# Patient Record
Sex: Male | Born: 1955 | Race: White | Hispanic: No | Marital: Married | Smoking: Never smoker
Health system: Southern US, Community
[De-identification: ages and names within clinical notes are randomized; demographics above are authoritative.]

## PROBLEM LIST (undated history)

## (undated) DIAGNOSIS — E785 Hyperlipidemia, unspecified: Secondary | ICD-10-CM

## (undated) HISTORY — DX: Hyperlipidemia, unspecified: E78.5

---

## 1998-05-20 HISTORY — PX: FISTULOTOMY: SHX6413

## 1999-03-23 ENCOUNTER — Ambulatory Visit (HOSPITAL_BASED_OUTPATIENT_CLINIC_OR_DEPARTMENT_OTHER): Admission: RE | Admit: 1999-03-23 | Discharge: 1999-03-23 | Payer: Self-pay | Admitting: *Deleted

## 1999-05-18 ENCOUNTER — Ambulatory Visit (HOSPITAL_COMMUNITY): Admission: RE | Admit: 1999-05-18 | Discharge: 1999-05-18 | Payer: Self-pay | Admitting: *Deleted

## 1999-08-03 ENCOUNTER — Encounter: Admission: RE | Admit: 1999-08-03 | Discharge: 1999-08-03 | Payer: Self-pay | Admitting: Urology

## 1999-08-03 ENCOUNTER — Encounter: Payer: Self-pay | Admitting: Urology

## 2000-05-19 ENCOUNTER — Ambulatory Visit (HOSPITAL_COMMUNITY): Admission: RE | Admit: 2000-05-19 | Discharge: 2000-05-19 | Payer: Self-pay | Admitting: Specialist

## 2000-05-19 ENCOUNTER — Encounter: Payer: Self-pay | Admitting: Specialist

## 2004-11-13 ENCOUNTER — Ambulatory Visit: Payer: Self-pay | Admitting: Family Medicine

## 2016-03-15 DIAGNOSIS — Z0000 Encounter for general adult medical examination without abnormal findings: Secondary | ICD-10-CM | POA: Diagnosis not present

## 2016-03-15 DIAGNOSIS — Z23 Encounter for immunization: Secondary | ICD-10-CM | POA: Diagnosis not present

## 2017-03-21 DIAGNOSIS — E559 Vitamin D deficiency, unspecified: Secondary | ICD-10-CM | POA: Diagnosis not present

## 2017-03-21 DIAGNOSIS — E7800 Pure hypercholesterolemia, unspecified: Secondary | ICD-10-CM | POA: Diagnosis not present

## 2017-03-21 DIAGNOSIS — Z0000 Encounter for general adult medical examination without abnormal findings: Secondary | ICD-10-CM | POA: Diagnosis not present

## 2017-03-21 DIAGNOSIS — Z125 Encounter for screening for malignant neoplasm of prostate: Secondary | ICD-10-CM | POA: Diagnosis not present

## 2017-07-08 DIAGNOSIS — J019 Acute sinusitis, unspecified: Secondary | ICD-10-CM | POA: Diagnosis not present

## 2017-09-12 DIAGNOSIS — M549 Dorsalgia, unspecified: Secondary | ICD-10-CM | POA: Diagnosis not present

## 2017-09-25 DIAGNOSIS — M5126 Other intervertebral disc displacement, lumbar region: Secondary | ICD-10-CM | POA: Diagnosis not present

## 2017-09-25 DIAGNOSIS — M5117 Intervertebral disc disorders with radiculopathy, lumbosacral region: Secondary | ICD-10-CM | POA: Diagnosis not present

## 2017-09-25 DIAGNOSIS — M47816 Spondylosis without myelopathy or radiculopathy, lumbar region: Secondary | ICD-10-CM | POA: Diagnosis not present

## 2017-10-31 DIAGNOSIS — R030 Elevated blood-pressure reading, without diagnosis of hypertension: Secondary | ICD-10-CM | POA: Diagnosis not present

## 2017-10-31 DIAGNOSIS — M48062 Spinal stenosis, lumbar region with neurogenic claudication: Secondary | ICD-10-CM | POA: Diagnosis not present

## 2017-10-31 DIAGNOSIS — M5126 Other intervertebral disc displacement, lumbar region: Secondary | ICD-10-CM | POA: Diagnosis not present

## 2017-10-31 DIAGNOSIS — Z6827 Body mass index (BMI) 27.0-27.9, adult: Secondary | ICD-10-CM | POA: Diagnosis not present

## 2017-11-16 DIAGNOSIS — B029 Zoster without complications: Secondary | ICD-10-CM | POA: Diagnosis not present

## 2017-12-03 DIAGNOSIS — R030 Elevated blood-pressure reading, without diagnosis of hypertension: Secondary | ICD-10-CM | POA: Diagnosis not present

## 2017-12-03 DIAGNOSIS — M5126 Other intervertebral disc displacement, lumbar region: Secondary | ICD-10-CM | POA: Diagnosis not present

## 2017-12-03 DIAGNOSIS — M48062 Spinal stenosis, lumbar region with neurogenic claudication: Secondary | ICD-10-CM | POA: Diagnosis not present

## 2017-12-03 DIAGNOSIS — Z6827 Body mass index (BMI) 27.0-27.9, adult: Secondary | ICD-10-CM | POA: Diagnosis not present

## 2017-12-26 DIAGNOSIS — Z23 Encounter for immunization: Secondary | ICD-10-CM | POA: Diagnosis not present

## 2018-01-05 DIAGNOSIS — H6501 Acute serous otitis media, right ear: Secondary | ICD-10-CM | POA: Diagnosis not present

## 2018-01-05 DIAGNOSIS — H9313 Tinnitus, bilateral: Secondary | ICD-10-CM | POA: Diagnosis not present

## 2018-01-28 DIAGNOSIS — H903 Sensorineural hearing loss, bilateral: Secondary | ICD-10-CM | POA: Diagnosis not present

## 2018-01-28 DIAGNOSIS — H9313 Tinnitus, bilateral: Secondary | ICD-10-CM | POA: Diagnosis not present

## 2018-03-06 DIAGNOSIS — Z23 Encounter for immunization: Secondary | ICD-10-CM | POA: Diagnosis not present

## 2018-04-02 DIAGNOSIS — Z0000 Encounter for general adult medical examination without abnormal findings: Secondary | ICD-10-CM | POA: Diagnosis not present

## 2018-04-02 DIAGNOSIS — Z125 Encounter for screening for malignant neoplasm of prostate: Secondary | ICD-10-CM | POA: Diagnosis not present

## 2018-04-02 DIAGNOSIS — E7800 Pure hypercholesterolemia, unspecified: Secondary | ICD-10-CM | POA: Diagnosis not present

## 2018-04-08 DIAGNOSIS — Z23 Encounter for immunization: Secondary | ICD-10-CM | POA: Diagnosis not present

## 2018-04-08 DIAGNOSIS — Z0000 Encounter for general adult medical examination without abnormal findings: Secondary | ICD-10-CM | POA: Diagnosis not present

## 2018-06-26 DIAGNOSIS — R9431 Abnormal electrocardiogram [ECG] [EKG]: Secondary | ICD-10-CM | POA: Diagnosis not present

## 2018-06-26 DIAGNOSIS — R002 Palpitations: Secondary | ICD-10-CM | POA: Diagnosis not present

## 2018-06-28 ENCOUNTER — Other Ambulatory Visit: Payer: Self-pay

## 2018-06-28 ENCOUNTER — Emergency Department (HOSPITAL_COMMUNITY): Payer: BLUE CROSS/BLUE SHIELD

## 2018-06-28 ENCOUNTER — Emergency Department (HOSPITAL_COMMUNITY)
Admission: EM | Admit: 2018-06-28 | Discharge: 2018-06-28 | Disposition: A | Discharge status: Home / Self Care | Payer: BLUE CROSS/BLUE SHIELD | Attending: Emergency Medicine | Admitting: Emergency Medicine

## 2018-06-28 ENCOUNTER — Encounter (HOSPITAL_COMMUNITY): Payer: Self-pay

## 2018-06-28 DIAGNOSIS — Z87891 Personal history of nicotine dependence: Secondary | ICD-10-CM | POA: Insufficient documentation

## 2018-06-28 DIAGNOSIS — Z79899 Other long term (current) drug therapy: Secondary | ICD-10-CM | POA: Insufficient documentation

## 2018-06-28 DIAGNOSIS — R072 Precordial pain: Secondary | ICD-10-CM | POA: Diagnosis not present

## 2018-06-28 DIAGNOSIS — R002 Palpitations: Secondary | ICD-10-CM | POA: Insufficient documentation

## 2018-06-28 DIAGNOSIS — R0789 Other chest pain: Secondary | ICD-10-CM | POA: Diagnosis not present

## 2018-06-28 LAB — CBC
HCT: 48.6 % (ref 39.0–52.0)
Hemoglobin: 16 g/dL (ref 13.0–17.0)
MCH: 30.1 pg (ref 26.0–34.0)
MCHC: 32.9 g/dL (ref 30.0–36.0)
MCV: 91.4 fL (ref 80.0–100.0)
PLATELETS: 218 10*3/uL (ref 150–400)
RBC: 5.32 MIL/uL (ref 4.22–5.81)
RDW: 13.3 % (ref 11.5–15.5)
WBC: 7.1 10*3/uL (ref 4.0–10.5)
nRBC: 0 % (ref 0.0–0.2)

## 2018-06-28 LAB — PHOSPHORUS: Phosphorus: 2.6 mg/dL (ref 2.5–4.6)

## 2018-06-28 LAB — POCT I-STAT TROPONIN I
Troponin i, poc: 0 ng/mL (ref 0.00–0.08)
Troponin i, poc: 0 ng/mL (ref 0.00–0.08)

## 2018-06-28 LAB — HEPATIC FUNCTION PANEL
ALT: 23 U/L (ref 0–44)
AST: 21 U/L (ref 15–41)
Albumin: 4.7 g/dL (ref 3.5–5.0)
Alkaline Phosphatase: 54 U/L (ref 38–126)
Bilirubin, Direct: 0.2 mg/dL (ref 0.0–0.2)
Indirect Bilirubin: 0.4 mg/dL (ref 0.3–0.9)
Total Bilirubin: 0.6 mg/dL (ref 0.3–1.2)
Total Protein: 7.8 g/dL (ref 6.5–8.1)

## 2018-06-28 LAB — BASIC METABOLIC PANEL
Anion gap: 9 (ref 5–15)
BUN: 20 mg/dL (ref 6–20)
CO2: 23 mmol/L (ref 22–32)
Calcium: 9.3 mg/dL (ref 8.9–10.3)
Chloride: 106 mmol/L (ref 98–111)
Creatinine, Ser: 1.02 mg/dL (ref 0.61–1.24)
GFR calc Af Amer: >60 mL/min (ref >60)
GFR calc non Af Amer: >60 mL/min (ref >60)
Glucose, Bld: 105 mg/dL — ABNORMAL HIGH (ref 70–99)
Potassium: 3.7 mmol/L (ref 3.5–5.1)
SODIUM: 138 mmol/L (ref 135–145)

## 2018-06-28 LAB — MAGNESIUM: MAGNESIUM: 2.5 mg/dL — AB (ref 1.7–2.4)

## 2018-06-28 LAB — TSH: TSH: 2.985 u[IU]/mL (ref 0.350–4.500)

## 2018-06-28 MED ORDER — FAMOTIDINE 20 MG PO TABS: 20.0000 mg | ORAL_TABLET | Freq: Two times a day (BID) | ORAL | 0 refills | Status: DC

## 2018-06-28 MED ORDER — SODIUM CHLORIDE 0.9% FLUSH: 3.0000 mL | Freq: Once | INTRAVENOUS | Status: DC

## 2018-06-28 MED ORDER — ASPIRIN 81 MG PO CHEW
324.0000 mg | CHEWABLE_TABLET | Freq: Once | ORAL | Status: AC
  Administered 2018-06-28: 324 mg via ORAL
  Filled 2018-06-28: qty 4

## 2018-06-28 MED ORDER — ASPIRIN 81 MG PO CHEW: 81.0000 mg | CHEWABLE_TABLET | Freq: Every day | ORAL | 0 refills | Status: AC

## 2018-06-28 NOTE — ED Triage Notes (Signed)
Pt states chest discomfort x 1 week. Pt states that it felt like fluttering Monday and Tuesday. Pt states he went to [facility], and had an EKG done. Pt states that it was abnormal, but that it was typical for him, as he had an event 9 years ago. Pt states he was told to come to the ED if the pain increased. The pain this morning had him wake up covered in sweat, and some dizziness as well. Pt states pain is on left chest. Pt states he has cardiology appt scheduled for 07/07/18.

## 2018-06-28 NOTE — Discharge Instructions (Signed)
1.  Call your cardiologist office and let them know you are seen in the emergency department.  See if you can be seen this week for recheck. 2.  Have a recheck with your family doctor this week if you cannot be seen by cardiology. 3.  Take a daily aspirin.  Take Pepcid twice a day in the morning and evening.  Take before your aspirin and with some food. 4.  Return to the emergency department if you have recurrence worsening new or concerning symptoms.

## 2018-06-28 NOTE — ED Notes (Signed)
ED Provider at bedside. 

## 2018-06-28 NOTE — ED Provider Notes (Signed)
[facility]-EMERGENCY DEPT Provider Note   CSN: [id number] Arrival date & time: 06/28/18  4503     History   Chief Complaint Chief Complaint  Patient presents with  . Chest Pain    HPI Raymond Wilson is a 63 y.o. male.  HPI Over the past week the patient has had several episodes of palpitations.  He first noted this on Monday.  There was a feeling of fluttering or racing in his left upper chest.  He did not have associated symptoms at that time.  It did resolve.  The following day he went on to workout at the gym and had no issues with chest pain or shortness of breath.  The subsequent day again he noted some fluttering in his chest.  Again it resolved and he did not note other symptoms.  Patient was having a few sporadic episodes and followed up with his PCP on Friday.  They did an EKG and did not find that there was anything concerning although they made note of a discrepancy between a much more distant EKG and the present 1.  Follow-up with cardiology was arranged for the 18th of this month.  Patient reports this morning however he awakened with a fullness and tightness sensation in his left chest.  Rather than abating it seemed to escalate.  Patient also noted that he felt flushed and a little sweaty.  In addition he felt lightheaded.  No syncopal episode.  Patient denies he has been sick in any way recently been active, going to the gym with negative review of systems.  Patient however does note that he mistakenly has been taking extra high doses of vitamin D supplement.  He was supposed to take 3000 IU and did not recognize that the bottle he was taking was 10,000 IU which she has been taking daily.  He reports he is been doing that for 3 to 4 weeks.  He however has negative review of systems for generalized symptoms of any sort. History reviewed. No pertinent past medical history.  There are no active problems to display for this patient.   Past Surgical History:    Procedure Laterality Date  . FISTULOTOMY  2000        Home Medications    Prior to Admission medications   Medication Sig Start Date End Date Taking? Authorizing Provider  Ascorbic Acid (VITAMIN C) 1000 MG tablet Take 1,000 mg by mouth daily.   Yes [provider]  Cholecalciferol (VITAMIN D3) 50 MCG (2000 UT) TABS Take 2,000 Units by mouth daily.   Yes [provider]  Multiple Vitamin (MULTIVITAMIN WITH MINERALS) TABS tablet Take 1 tablet by mouth daily.   Yes [provider]  naproxen sodium (ALEVE) 220 MG tablet Take 440 mg by mouth daily.   Yes [provider]  Omega-3 Fatty Acids (EQL OMEGA 3 FISH OIL) 1400 MG CAPS Take 1,400 mg by mouth daily.   Yes [provider]  pravastatin (PRAVACHOL) 10 MG tablet Take 10 mg by mouth daily. 06/21/18  Yes [provider]  aspirin 81 MG chewable tablet Chew 1 tablet (81 mg total) by mouth daily. 06/28/18   Arby Barrette, MD  famotidine (PEPCID) 20 MG tablet Take 1 tablet (20 mg total) by mouth 2 (two) times daily. 06/28/18   Arby Barrette, MD    Family History No family history on file.  Social History Social History   Tobacco Use  . Smoking status: Never Smoker  .  Smokeless tobacco: Never Used  Substance Use Topics  . Alcohol use: Yes    Comment: socially  . Drug use: Never     Allergies   Patient has no known allergies.   Review of Systems Review of Systems 10 Systems reviewed and are negative for acute change except as noted in the HPI.   Physical Exam Updated Vital Signs BP 115/84   Pulse 66   Temp 98.4 F (36.9 C) (Oral)   Resp 18   Ht 5\' 10"  (1.778 m)   Wt 90.7 kg   SpO2 96%   BMI 28.70 kg/m   Physical Exam Constitutional:      Appearance: He is well-developed.  HENT:     Head: Normocephalic and atraumatic.  Eyes:     Pupils: Pupils are equal, round, and reactive to light.  Neck:     Musculoskeletal: Neck supple.  Cardiovascular:     Rate and  Rhythm: Normal rate and regular rhythm.     Heart sounds: Normal heart sounds.  Pulmonary:     Effort: Pulmonary effort is normal.     Breath sounds: Normal breath sounds.  Abdominal:     General: Bowel sounds are normal. There is no distension.     Palpations: Abdomen is soft.     Tenderness: There is no abdominal tenderness.  Musculoskeletal: Normal range of motion.  Skin:    General: Skin is warm and dry.  Neurological:     Mental Status: He is alert and oriented to person, place, and time.     GCS: GCS eye subscore is 4. GCS verbal subscore is 5. GCS motor subscore is 6.     Coordination: Coordination normal.      ED Treatments / Results  Labs (all labs ordered are listed, but only abnormal results are displayed) Labs Reviewed  BASIC METABOLIC PANEL - Abnormal; Notable for the following components:      Result Value   Glucose, Bld 105 (*)    All other components within normal limits  MAGNESIUM - Abnormal; Notable for the following components:   Magnesium 2.5 (*)    All other components within normal limits  CBC  TSH  PHOSPHORUS  HEPATIC FUNCTION PANEL  CALCIUM, IONIZED  I-STAT TROPONIN, ED  POCT I-STAT TROPONIN I  I-STAT TROPONIN, ED  POCT I-STAT TROPONIN I    EKG EKG Interpretation  Date/Time:  Sunday June 28 2018 07:41:56 EST Ventricular Rate:  84 PR Interval:    QRS Duration: 88 QT Interval:  357 QTC Calculation: 422 R Axis:   90 Text Interpretation:  Sinus rhythm Borderline right axis deviation Borderline low voltage, extremity leads Baseline wander in lead(s) V3 normal, no old comp. Confirmed by Arby BarrettePfeiffer, Dellis Voght [phone]([id number]) on 06/28/2018 7:56:34 AM   Radiology Dg Chest 2 View  Result Date: 06/28/2018 CLINICAL DATA:  Chest pain. EXAM: CHEST - 2 VIEW COMPARISON:  None. FINDINGS: The cardiac silhouette, mediastinal and hilar contours are normal. The lungs are clear. No pleural effusion. The bony thorax is intact. IMPRESSION: No acute cardiopulmonary  findings. Electronically Signed   By: Rudie MeyerP.  Gallerani M.D.   On: 06/28/2018 08:01    Procedures Procedures (including critical care time)  Medications Ordered in ED Medications  sodium chloride flush (NS) 0.9 % injection 3 mL (0 mLs Intravenous Hold 06/28/18 0757)  aspirin chewable tablet 324 mg (324 mg Oral Given 06/28/18 0827)     Initial Impression / Assessment and Plan / ED Course  I have reviewed the  triage vital signs and the nursing notes.  Pertinent labs & imaging results that were available during my care of the patient were reviewed by me and considered in my medical decision making (see chart for details).     Alert and clinically well.  Patient is low risk factor for coronary artery disease.  2 sets of enzymes are negative.  Patient has been exercising without chest pain or dyspnea this week.  Will have patient take a daily aspirin and to twice daily Pepcid.  Return precautions reviewed.  Patient has a follow-up with cardiology in 8 days.  Will have him call to see if he can be seen this week.  Other recommendations for follow-up with PCP and return precautions reviewed.  Final Clinical Impressions(s) / ED Diagnoses   Final diagnoses:  Precordial pain  Palpitation    ED Discharge Orders         Ordered    aspirin 81 MG chewable tablet  Daily     06/28/18 1238    famotidine (PEPCID) 20 MG tablet  2 times daily     06/28/18 1238           Arby BarrettePfeiffer, Durante Violett, MD 06/28/18 1242

## 2018-06-29 ENCOUNTER — Encounter: Payer: Self-pay | Admitting: Cardiology

## 2018-06-29 ENCOUNTER — Ambulatory Visit: Payer: BLUE CROSS/BLUE SHIELD | Admitting: Cardiology

## 2018-06-29 VITALS — BP 120/86 | HR 80 | Ht 70.0 in | Wt 203.4 lb

## 2018-06-29 DIAGNOSIS — R002 Palpitations: Secondary | ICD-10-CM | POA: Diagnosis not present

## 2018-06-29 DIAGNOSIS — R072 Precordial pain: Secondary | ICD-10-CM | POA: Diagnosis not present

## 2018-06-29 LAB — CALCIUM, IONIZED

## 2018-06-29 NOTE — Patient Instructions (Signed)
Medication Instructions:  NO CHANGE If you need a refill on your cardiac medications before your next appointment, please call your pharmacy.   Lab work: If you have labs (blood work) drawn today and your tests are completely normal, you will receive your results only by: Marland Kitchen MyChart Message (if you have MyChart) OR . A paper copy in the mail If you have any lab test that is abnormal or we need to change your treatment, we will call you to review the results.  Testing/Procedures: Your physician has requested that you have an echocardiogram. Echocardiography is a painless test that uses sound waves to create images of your heart. It provides your doctor with information about the size and shape of your heart and how well your heart's chambers and valves are working. This procedure takes approximately one hour. There are no restrictions for this procedure. [address] [facility]  Your physician has requested that you have an exercise tolerance test. For further information please visit [email or web address] Please also follow instruction sheet, as given.    Follow-Up: At [facility], you and your health needs are our priority.  As part of our continuing mission to provide you with exceptional heart care, we have created designated Provider Care Teams.  These Care Teams include your primary Cardiologist (physician) and Advanced Practice Providers (APPs -  Physician Assistants and Nurse Practitioners) who all work together to provide you with the care you need, when you need it. You will need a follow up appointment in 6 months.  Please call our office 2 months in advance to schedule this appointment.  You may see Olga Millers MD or one of the following Advanced Practice Providers on your designated Care Team:   Corine Shelter, PA-C Judy Pimple, [place] . Marjie Skiff, PA-C  CALL IN June TO SCHEDULE APPOINTMENT IN [place]

## 2018-06-29 NOTE — Progress Notes (Signed)
Referring-Marcie Pfeiffer, MD Reason for referral-chest pain  HPI: 63 year old male for evaluation of chest pain at request of Arby BarretteMarcy Pfeiffer, MD.  Patient seen in the emergency room yesterday with chest pain.  Troponins normal.  Hemoglobin 16.  Liver functions normal.  Chest x-ray negative.  Electrocardiogram with no ST changes.  Patient states that approximately 1 week ago he would have intermittent palpitations.  They are in the left upper chest and described as a brief flutter.  He had mild dizziness on Tuesday of last week.  Recurrent dizziness on Friday.  On Sunday he had pain in his left upper chest described as pressure.  Some radiation to the center of his chest.  No associated symptoms.  Not pleuritic, positional.  Resolved after 1-1/2 hours.  Otherwise denies dyspnea on exertion, orthopnea, PND, pedal edema, exertional chest pain or syncope.  Exercises routinely on the elliptical with no chest pain.  Because of the above cardiology asked to evaluate.  Current Outpatient Medications  Medication Sig Dispense Refill  . Ascorbic Acid (VITAMIN C) 1000 MG tablet Take 1,000 mg by mouth daily.    Marland Kitchen. aspirin 81 MG chewable tablet Chew 1 tablet (81 mg total) by mouth daily. 30 tablet 0  . Cholecalciferol (VITAMIN D3) 50 MCG (2000 UT) TABS Take 2,000 Units by mouth daily.    . famotidine (PEPCID) 20 MG tablet Take 1 tablet (20 mg total) by mouth 2 (two) times daily. 30 tablet 0  . Multiple Vitamin (MULTIVITAMIN WITH MINERALS) TABS tablet Take 1 tablet by mouth daily.    . naproxen sodium (ALEVE) 220 MG tablet Take 440 mg by mouth daily.    . Omega-3 Fatty Acids (EQL OMEGA 3 FISH OIL) 1400 MG CAPS Take 1,400 mg by mouth daily.    . pravastatin (PRAVACHOL) 10 MG tablet Take 10 mg by mouth daily.     No current facility-administered medications for this visit.     No Known Allergies   Past Medical History:  Diagnosis Date  . Hyperlipidemia     Past Surgical History:  Procedure  Laterality Date  . FISTULOTOMY  2000    Social History   Socioeconomic History  . Marital status: Married    Spouse name: Not on file  . Number of children: 2  . Years of education: Not on file  . Highest education level: Not on file  Occupational History    Comment: Sales  Social Needs  . Financial resource strain: Not on file  . Food insecurity:    Worry: Not on file    Inability: Not on file  . Transportation needs:    Medical: Not on file    Non-medical: Not on file  Tobacco Use  . Smoking status: Never Smoker  . Smokeless tobacco: Never Used  Substance and Sexual Activity  . Alcohol use: Yes    Comment: socially  . Drug use: Never  . Sexual activity: Not on file  Lifestyle  . Physical activity:    Days per week: Not on file    Minutes per session: Not on file  . Stress: Not on file  Relationships  . Social connections:    Talks on phone: Not on file    Gets together: Not on file    Attends religious service: Not on file    Active member of club or organization: Not on file    Attends meetings of clubs or organizations: Not on file    Relationship status: Not on file  .  Intimate partner violence:    Fear of current or ex partner: Not on file    Emotionally abused: Not on file    Physically abused: Not on file    Forced sexual activity: Not on file  Other Topics Concern  . Not on file  Social History Narrative  . Not on file    Family History  Problem Relation Age of Onset  . Alzheimer's disease Mother   . CVA Father   . Anxiety disorder Brother     ROS: no fevers or chills, productive cough, hemoptysis, dysphasia, odynophagia, melena, hematochezia, dysuria, hematuria, rash, seizure activity, orthopnea, PND, pedal edema, claudication. Remaining systems are negative.  Physical Exam:   Blood pressure 120/86, pulse 80, height 5\' 10"  (1.778 m), weight 203 lb 6.4 oz (92.3 kg), SpO2 97 %.  General:  Well developed/well nourished in NAD Skin  warm/dry Patient not depressed No peripheral clubbing Back-normal HEENT-normal/normal eyelids Neck supple/normal carotid upstroke bilaterally; no bruits; no JVD; no thyromegaly chest - CTA/ normal expansion CV - RRR/normal S1 and S2; no murmurs, rubs or gallops;  PMI nondisplaced Abdomen -NT/ND, no HSM, no mass, + bowel sounds, no bruit 2+ femoral pulses, no bruits Ext-no edema, chords, 2+ DP Neuro-grossly nonfocal  ECG -June 28, 2018 personally reviewed; sinus rhythm with right axis deviation.  A/P  1 chest pain-symptoms are atypical.  Enzymes were negative in the emergency room.  Electrocardiogram shows no ST changes.  We will schedule an exercise treadmill for risk stratification.  Echo to assess wall motion.  2 palpitations-etiology unclear.  We can can pursue event monitor in the future if symptoms worsen.  We also discussed the possibility of an apple watch.  Check echocardiogram for LV function.  3 hyperlipidemia-Per primary care.  Olga Millers, MD

## 2018-06-30 ENCOUNTER — Telehealth (HOSPITAL_COMMUNITY): Payer: Self-pay

## 2018-06-30 NOTE — Telephone Encounter (Signed)
Encounter complete. 

## 2018-07-01 ENCOUNTER — Telehealth (HOSPITAL_COMMUNITY): Payer: Self-pay | Admitting: *Deleted

## 2018-07-01 NOTE — Telephone Encounter (Signed)
Close encounter 

## 2018-07-03 ENCOUNTER — Ambulatory Visit (HOSPITAL_COMMUNITY)
Admission: RE | Admit: 2018-07-03 | Discharge: 2018-07-03 | Disposition: A | Discharge status: Home / Self Care | Payer: BLUE CROSS/BLUE SHIELD | Source: Ambulatory Visit | Attending: Cardiology | Admitting: Cardiology

## 2018-07-03 ENCOUNTER — Ambulatory Visit (HOSPITAL_BASED_OUTPATIENT_CLINIC_OR_DEPARTMENT_OTHER): Payer: BLUE CROSS/BLUE SHIELD

## 2018-07-03 DIAGNOSIS — R072 Precordial pain: Secondary | ICD-10-CM

## 2018-07-03 DIAGNOSIS — R002 Palpitations: Secondary | ICD-10-CM

## 2018-07-03 LAB — EXERCISE TOLERANCE TEST
CHL CUP RESTING HR STRESS: 69 {beats}/min
Estimated workload: 13.8 METS
Exercise duration (min): 12 min
Exercise duration (sec): 15 s
MPHR: 160 {beats}/min
Peak HR: 173 {beats}/min
Percent HR: 108 %
RPE: 19

## 2018-07-07 ENCOUNTER — Ambulatory Visit: Payer: Self-pay | Admitting: Cardiovascular Disease

## 2018-07-08 ENCOUNTER — Other Ambulatory Visit: Payer: Self-pay | Admitting: *Deleted

## 2018-07-08 DIAGNOSIS — I712 Thoracic aortic aneurysm, without rupture, unspecified: Secondary | ICD-10-CM

## 2018-07-10 ENCOUNTER — Ambulatory Visit (INDEPENDENT_AMBULATORY_CARE_PROVIDER_SITE_OTHER)
Admission: RE | Admit: 2018-07-10 | Discharge: 2018-07-10 | Disposition: A | Discharge status: Home / Self Care | Payer: BLUE CROSS/BLUE SHIELD | Source: Ambulatory Visit | Attending: Cardiology | Admitting: Cardiology

## 2018-07-10 DIAGNOSIS — I712 Thoracic aortic aneurysm, without rupture, unspecified: Secondary | ICD-10-CM

## 2018-07-10 MED ORDER — IOPAMIDOL (ISOVUE-370) INJECTION 76%
100.0000 mL | Freq: Once | INTRAVENOUS | Status: AC | PRN
  Administered 2018-07-10: 100 mL via INTRAVENOUS

## 2018-07-15 NOTE — Progress Notes (Signed)
HPI: FU CP and TAA.  Patient seen in the emergency room 2/20 with chest pain.  Troponins normal.  Hemoglobin 16.  Liver functions normal.  Chest x-ray negative.  Electrocardiogram with no ST changes.    Echocardiogram February 2020 showed normal LV function, mild diastolic dysfunction, trileaflet aortic valve; dilated ascending aorta at 49 mm.  Exercise treadmill February 2020 negative.  CTA February 2020 showed 4.8 cm ascending thoracic aortic aneurysm. Since last seen, there is no dyspnea, chest pain or syncope.  Occasional brief palpitations.  Current Outpatient Medications  Medication Sig Dispense Refill  . Ascorbic Acid (VITAMIN C) 1000 MG tablet Take 1,000 mg by mouth daily.    Marland Kitchen aspirin 81 MG chewable tablet Chew 1 tablet (81 mg total) by mouth daily. 30 tablet 0  . Cholecalciferol (VITAMIN D3) 50 MCG (2000 UT) TABS Take 2,000 Units by mouth daily.    . Multiple Vitamin (MULTIVITAMIN WITH MINERALS) TABS tablet Take 1 tablet by mouth daily.    . naproxen sodium (ALEVE) 220 MG tablet Take 440 mg by mouth daily.    . Omega-3 Fatty Acids (EQL OMEGA 3 FISH OIL) 1400 MG CAPS Take 1,400 mg by mouth daily.    . pravastatin (PRAVACHOL) 10 MG tablet Take 10 mg by mouth daily.     No current facility-administered medications for this visit.      Past Medical History:  Diagnosis Date  . Hyperlipidemia     Past Surgical History:  Procedure Laterality Date  . FISTULOTOMY  2000    Social History   Socioeconomic History  . Marital status: Married    Spouse name: Not on file  . Number of children: 2  . Years of education: Not on file  . Highest education level: Not on file  Occupational History    Comment: Sales  Social Needs  . Financial resource strain: Not on file  . Food insecurity:    Worry: Not on file    Inability: Not on file  . Transportation needs:    Medical: Not on file    Non-medical: Not on file  Tobacco Use  . Smoking status: Never Smoker  . Smokeless  tobacco: Never Used  Substance and Sexual Activity  . Alcohol use: Yes    Comment: socially  . Drug use: Never  . Sexual activity: Not on file  Lifestyle  . Physical activity:    Days per week: Not on file    Minutes per session: Not on file  . Stress: Not on file  Relationships  . Social connections:    Talks on phone: Not on file    Gets together: Not on file    Attends religious service: Not on file    Active member of club or organization: Not on file    Attends meetings of clubs or organizations: Not on file    Relationship status: Not on file  . Intimate partner violence:    Fear of current or ex partner: Not on file    Emotionally abused: Not on file    Physically abused: Not on file    Forced sexual activity: Not on file  Other Topics Concern  . Not on file  Social History Narrative  . Not on file    Family History  Problem Relation Age of Onset  . Alzheimer's disease Mother   . CVA Father   . Anxiety disorder Brother     ROS: no fevers or chills, productive cough, hemoptysis,  dysphasia, odynophagia, melena, hematochezia, dysuria, hematuria, rash, seizure activity, orthopnea, PND, pedal edema, claudication. Remaining systems are negative.  Physical Exam: Well-developed well-nourished in no acute distress.  Skin is warm and dry.  HEENT is normal.  Neck is supple.  Chest is clear to auscultation with normal expansion.  Cardiovascular exam is regular rate and rhythm.  Abdominal exam nontender or distended. No masses palpated. Extremities show no edema. neuro grossly intact  A/P  1 thoracic aortic aneurysm-I had a long discussion with patient and his wife today concerning his aneurysm.  He does not have a bicuspid valve and there is no family history of aneurysm.  We will plan to repeat CTA August 2020.  Follow blood pressure and if systolic is greater than 130 would add ARB.  I have instructed him to avoid isometric exercising such as weight lifting.  I also  instructed him to avoid quinolones.  2 chest pain-no recurrent symptoms.  Recent exercise treadmill negative.  No plans for further ischemia evaluation at this point.  3 palpitations-brief flutters but no sustained palpitations.  We can consider a monitor in the future if needed.  4 hyperlipidemia-continue statin.  Managed by primary care.  Olga Millers, MD

## 2018-07-20 ENCOUNTER — Encounter: Payer: Self-pay | Admitting: Cardiology

## 2018-07-20 ENCOUNTER — Ambulatory Visit: Payer: BLUE CROSS/BLUE SHIELD | Admitting: Cardiology

## 2018-07-20 VITALS — BP 124/72 | HR 74 | Ht 70.0 in | Wt 200.0 lb

## 2018-07-20 DIAGNOSIS — R002 Palpitations: Secondary | ICD-10-CM | POA: Diagnosis not present

## 2018-07-20 DIAGNOSIS — R072 Precordial pain: Secondary | ICD-10-CM

## 2018-07-20 DIAGNOSIS — I712 Thoracic aortic aneurysm, without rupture, unspecified: Secondary | ICD-10-CM

## 2018-07-20 NOTE — Patient Instructions (Signed)
Medication Instructions:  NO CHANGE If you need a refill on your cardiac medications before your next appointment, please call your pharmacy.   Lab work: If you have labs (blood work) drawn today and your tests are completely normal, you will receive your results only by: Marland Kitchen MyChart Message (if you have MyChart) OR . A paper copy in the mail If you have any lab test that is abnormal or we need to change your treatment, we will call you to review the results.  Follow-Up: At [facility], you and your health needs are our priority.  As part of our continuing mission to provide you with exceptional heart care, we have created designated Provider Care Teams.  These Care Teams include your primary Cardiologist (physician) and Advanced Practice Providers (APPs -  Physician Assistants and Nurse Practitioners) who all work together to provide you with the care you need, when you need it. You will need a follow up appointment in 5 months.  Please call our office 2 months in advance to schedule this appointment.  You may see Olga Millers MD or one of the following Advanced Practice Providers on your designated Care Team:   Corine Shelter, PA-C Judy Pimple, [place] . Marjie Skiff, PA-C

## 2018-09-14 NOTE — Telephone Encounter (Signed)
Spoke with pt, since Thursday night last week he has noticed a tightness in his chest. It occurs off and on. He is usually at rest. He is having muscle twitches in the back of his right. Denies SOB and taking a deep breath eases the discomfort for a while and then it comes back. The discomfort feels like a stick and he can not reproduce the pain with movement. He is walking everyday and has not discomfort with any exertion. He feels like it maybe anxiety about the thoracic aneurysm and it possibly getting worse. Will forward for dr Jens Som review

## 2018-09-15 NOTE — Progress Notes (Signed)
Virtual Visit via Video Note   This visit type was conducted due to national recommendations for restrictions regarding the COVID-19 Pandemic (e.g. social distancing) in an effort to limit this patient's exposure and mitigate transmission in our community.  Due to his co-morbid illnesses, this patient is at least at moderate risk for complications without adequate follow up.  This format is felt to be most appropriate for this patient at this time.  All issues noted in this document were discussed and addressed.  A limited physical exam was performed with this format.  Please refer to the patient's chart for his consent to telehealth for [facility].   Evaluation Performed:  Follow-up visit  Date:  09/16/2018   ID:  Raymond Wilson, DOB November 28, 1955, MRN [id number]  Patient Location: Home Provider Location: Home  PCP:  Catha GosselinLittle, Kevin, MD  Cardiologist:  Dr Jens Somrenshaw  Chief Complaint:  CP  History of Present Illness:    FU CP and TAA. Patient seen in the emergency room 2/20 with chest pain. Troponins normal. Hemoglobin 16. Liver functions normal. Chest x-ray negative. Electrocardiogram with no ST changes. Echocardiogram February 2020 showed normal LV function, mild diastolic dysfunction, trileaflet aortic valve; dilated ascending aorta at 49 mm.  Exercise treadmill February 2020 negative.  CTA February 2020 showed 4.8 cm ascending thoracic aortic aneurysm.  Patient contacted the office with chest discomfort and was added to my schedule.  Since last seen,  over the past 6 weeks patient has had intermittent chest pain.  The pain is predominantly left of the sternum but can be in the right chest and throat.  It can be intermittent all day.  There is some relief with deep inspiration.  He feels this predominantly when sitting still.  He can walk 3 to 4 miles without having chest pain.  There is no nausea.  Occasional mild dyspnea and palpitations.  The patient does not have symptoms concerning  for COVID-19 infection (fever, chills, cough, or new shortness of breath).    Past Medical History:  Diagnosis Date  . Hyperlipidemia    Past Surgical History:  Procedure Laterality Date  . FISTULOTOMY  2000     Current Meds  Medication Sig  . Ascorbic Acid (VITAMIN C) 1000 MG tablet Take 1,000 mg by mouth daily.  Marland Kitchen. aspirin 81 MG chewable tablet Chew 1 tablet (81 mg total) by mouth daily.  . Cholecalciferol (VITAMIN D3 PO) Take 100 mcg by mouth daily.  . Multiple Vitamin (MULTIVITAMIN WITH MINERALS) TABS tablet Take 1 tablet by mouth daily.  . naproxen sodium (ALEVE) 220 MG tablet Take 440 mg by mouth daily.  . Omega-3 Fatty Acids (EQL OMEGA 3 FISH OIL) 1400 MG CAPS Take 1,400 mg by mouth daily.  . pravastatin (PRAVACHOL) 10 MG tablet Take 10 mg by mouth daily.     Allergies:   Patient has no known allergies.   Social History   Tobacco Use  . Smoking status: Never Smoker  . Smokeless tobacco: Never Used  Substance Use Topics  . Alcohol use: Yes    Comment: socially  . Drug use: Never     Family Hx: The patient's family history includes Alzheimer's disease in his mother; Anxiety disorder in his brother; CVA in his father.  ROS:   Please see the history of present illness.    She denies fevers, chills or productive cough. All other systems reviewed and are negative.   Recent Labs: 06/28/2018: ALT 23; BUN 20; Creatinine, Ser 1.02; Hemoglobin  16.0; Magnesium 2.5; Platelets 218; Potassium 3.7; Sodium 138; TSH 2.985   Wt Readings from Last 3 Encounters:  09/16/18 205 lb (93 kg)  07/20/18 200 lb (90.7 kg)  06/29/18 203 lb 6.4 oz (92.3 kg)     Objective:    Vital Signs:  BP 139/88   Pulse 77   Ht 5\' 10"  (1.778 m)   Wt 205 lb (93 kg)   BMI 29.41 kg/m    VITAL SIGNS:  reviewed  Patient answers questions appropriately. No acute distress. Normal affect. Remainder of physical examination not performed (telehealth visit; coronavirus pandemic)  ASSESSMENT & PLAN:     1. Chest pain-symptoms are atypical and he feels may be related to stress.  Patient is scheduled for repeat CTA of thoracic aorta August.  At that time we will change to cardiac CTA to evaluate coronaries as well. 2. Thoracic aortic aneurysm-as outlined in previous notes patient does not have a bicuspid valve and there is no family history of dissection or aneurysm.  Plan repeat CTA August 2020.  We discussed avoiding isometric exercise such as weight lifting and avoiding quinolones. 3. Hyperlipidemia-continue statin. 4. Palpitations-occasional brief flutters but patient denies sustained palpitations.  We will not pursue further evaluation at this point. 5. Elevated blood pressure-patient's blood pressure is increasing.  I would like optimal control in setting of thoracic aortic aneurysm.  I will add losartan 25 mg daily.  We will advance as needed.  Check potassium and renal function in 1 week.  COVID-19 Education: The importance of social distancing was discussed today.  Time:   Today, I have spent 17 minutes with the patient with telehealth technology discussing the above problems.     Medication Adjustments/Labs and Tests Ordered: Current medicines are reviewed at length with the patient today.  Concerns regarding medicines are outlined above.   Tests Ordered: No orders of the defined types were placed in this encounter.   Medication Changes: No orders of the defined types were placed in this encounter.   Disposition:  Follow up in 8 week(s)  Signed, Olga Millers, MD  09/16/2018 10:36 AM    [facility]

## 2018-09-16 ENCOUNTER — Other Ambulatory Visit: Payer: Self-pay

## 2018-09-16 ENCOUNTER — Telehealth: Payer: Self-pay

## 2018-09-16 ENCOUNTER — Telehealth (INDEPENDENT_AMBULATORY_CARE_PROVIDER_SITE_OTHER): Payer: BLUE CROSS/BLUE SHIELD | Admitting: Cardiology

## 2018-09-16 VITALS — BP 139/88 | HR 77 | Ht 70.0 in | Wt 205.0 lb

## 2018-09-16 DIAGNOSIS — R072 Precordial pain: Secondary | ICD-10-CM

## 2018-09-16 DIAGNOSIS — E7800 Pure hypercholesterolemia, unspecified: Secondary | ICD-10-CM

## 2018-09-16 DIAGNOSIS — I712 Thoracic aortic aneurysm, without rupture, unspecified: Secondary | ICD-10-CM

## 2018-09-16 DIAGNOSIS — R002 Palpitations: Secondary | ICD-10-CM

## 2018-09-16 MED ORDER — LOSARTAN POTASSIUM 25 MG PO TABS: 25.0000 mg | ORAL_TABLET | Freq: Every day | ORAL | 3 refills | Status: DC

## 2018-09-16 NOTE — Progress Notes (Signed)
Pt forgot to let Dr. Darel Hong know that he was taking Melatonin QHS... added to his med list.

## 2018-09-16 NOTE — Telephone Encounter (Signed)
Virtual Visit Pre-Appointment Phone Call  "(Name), I am calling you today to discuss your upcoming appointment. We are currently trying to limit exposure to the virus that causes COVID-19 by seeing patients at home rather than in the office."  1. "What is the BEST phone number to call the day of the visit?" - include this in appointment notes  2. "Do you have or have access to (through a family member/friend) a smartphone with video capability that we can use for your visit?" a. If yes - list this number in appt notes as "cell" (if different from BEST phone #) and list the appointment type as a VIDEO visit in appointment notes b. If no - list the appointment type as a PHONE visit in appointment notes  3. Confirm consent - "In the setting of the current Covid19 crisis, you are scheduled for a (phone or video) visit with your provider on (date) at (time).  Just as we do with many in-office visits, in order for you to participate in this visit, we must obtain consent.  If you'd like, I can send this to your mychart (if signed up) or email for you to review.  Otherwise, I can obtain your verbal consent now.  All virtual visits are billed to your insurance company just like a normal visit would be.  By agreeing to a virtual visit, we'd like you to understand that the technology does not allow for your provider to perform an examination, and thus may limit your provider's ability to fully assess your condition. If your provider identifies any concerns that need to be evaluated in person, we will make arrangements to do so.  Finally, though the technology is pretty good, we cannot assure that it will always work on either your or our end, and in the setting of a video visit, we may have to convert it to a phone-only visit.  In either situation, we cannot ensure that we have a secure connection.  Are you willing to proceed?" STAFF: Did the patient verbally acknowledge consent to telehealth visit? Document  YES/NO here: YES  4. Advise patient to be prepared - "Two hours prior to your appointment, go ahead and check your blood pressure, pulse, oxygen saturation, and your weight (if you have the equipment to check those) and write them all down. When your visit starts, your provider will ask you for this information. If you have an Apple Watch or Kardia device, please plan to have heart rate information ready on the day of your appointment. Please have a pen and paper handy nearby the day of the visit as well."  5. Give patient instructions for MyChart download to smartphone OR Doximity/Doxy.me as below if video visit (depending on what platform provider is using)  6. Inform patient they will receive a phone call 15 minutes prior to their appointment time (may be from unknown caller ID) so they should be prepared to answer    TELEPHONE CALL NOTE  Raymond Wilson has been deemed a candidate for a follow-up tele-health visit to limit community exposure during the Covid-19 pandemic. I spoke with the patient via phone to ensure availability of phone/video source, confirm preferred email & phone number, and discuss instructions and expectations.  I reminded Raymond Wilson to be prepared with any vital sign and/or heart rhythm information that could potentially be obtained via home monitoring, at the time of his visit. I reminded Raymond Wilson to expect a phone call prior to  his visit.  Vineta Carone, CMA 09/16/2018 10:25 AM   INSTRUCTIONS FOR DOWNLOADING THE MYCHART APP TO SMARTPHONE  - The patient must first make sure to have activated MyChart and know their login information - If Apple, go to [employer] Store and type in MyChart in the search bar and download the app. If Android, ask patient to go to [employer] Play Store and type in [facility] in the search bar and download the app. The app is free but as with any other app downloads, their phone may require them to verify saved payment information or Apple/Android  password.  - The patient will need to then log into the app with their MyChart username and password, and select [facility] as their healthcare provider to link the account. When it is time for your visit, go to the MyChart app, find appointments, and click Begin Video Visit. Be sure to Select Allow for your device to access the Microphone and Camera for your visit. You will then be connected, and your provider will be with you shortly.  **If they have any issues connecting, or need assistance please contact MyChart service desk [phone])**  **If using a computer, in order to ensure the best quality for their visit they will need to use either of the following Internet Browsers: [employer] Edge, or Google Chrome**  IF USING DOXIMITY or DOXY.ME - The patient will receive a link just prior to their visit by text.     FULL LENGTH CONSENT FOR TELE-HEALTH VISIT   I hereby voluntarily request, consent and authorize [facility] and its employed or contracted physicians, physician assistants, nurse practitioners or other licensed health care professionals (the Practitioner), to provide me with telemedicine health care services (the "Services") as deemed necessary by the treating Practitioner. I acknowledge and consent to receive the Services by the Practitioner via telemedicine. I understand that the telemedicine visit will involve communicating with the Practitioner through live audiovisual communication technology and the disclosure of certain medical information by electronic transmission. I acknowledge that I have been given the opportunity to request an in-person assessment or other available alternative prior to the telemedicine visit and am voluntarily participating in the telemedicine visit.  I understand that I have the right to withhold or withdraw my consent to the use of telemedicine in the course of my care at any time, without affecting my right to future care or treatment,  and that the Practitioner or I may terminate the telemedicine visit at any time. I understand that I have the right to inspect all information obtained and/or recorded in the course of the telemedicine visit and may receive copies of available information for a reasonable fee.  I understand that some of the potential risks of receiving the Services via telemedicine include:  Marland Kitchen. Delay or interruption in medical evaluation due to technological equipment failure or disruption; . Information transmitted may not be sufficient (e.g. poor resolution of images) to allow for appropriate medical decision making by the Practitioner; and/or  . In rare instances, security protocols could fail, causing a breach of personal health information.  Furthermore, I acknowledge that it is my responsibility to provide information about my medical history, conditions and care that is complete and accurate to the best of my ability. I acknowledge that Practitioner's advice, recommendations, and/or decision may be based on factors not within their control, such as incomplete or inaccurate data provided by me or distortions of diagnostic images or specimens that may result from electronic transmissions. I  understand that the practice of medicine is not an exact science and that Practitioner makes no warranties or guarantees regarding treatment outcomes. I acknowledge that I will receive a copy of this consent concurrently upon execution via email to the email address I last provided but may also request a printed copy by calling the office of [facility].    I understand that my insurance will be billed for this visit.   I have read or had this consent read to me. . I understand the contents of this consent, which adequately explains the benefits and risks of the Services being provided via telemedicine.  . I have been provided ample opportunity to ask questions regarding this consent and the Services and have had my questions  answered to my satisfaction. . I give my informed consent for the services to be provided through the use of telemedicine in my medical care  By participating in this telemedicine visit I agree to the above.

## 2018-09-16 NOTE — Patient Instructions (Signed)
Medication Instructions:  START LOSARTAN 25 MG ONCE DAILY If you need a refill on your cardiac medications before your next appointment, please call your pharmacy.   Lab work: Your physician recommends that you return for lab work in: ONE WEEK If you have labs (blood work) drawn today and your tests are completely normal, you will receive your results only by: Marland Kitchen MyChart Message (if you have MyChart) OR . A paper copy in the mail If you have any lab test that is abnormal or we need to change your treatment, we will call you to review the results.  Follow-Up: At [facility], you and your health needs are our priority.  As part of our continuing mission to provide you with exceptional heart care, we have created designated Provider Care Teams.  These Care Teams include your primary Cardiologist (physician) and Advanced Practice Providers (APPs -  Physician Assistants and Nurse Practitioners) who all work together to provide you with the care you need, when you need it. Your physician recommends that you schedule a follow-up appointment in: 8 WEEKS WITH DR Jens Som

## 2018-09-25 DIAGNOSIS — R002 Palpitations: Secondary | ICD-10-CM | POA: Diagnosis not present

## 2018-09-26 LAB — BASIC METABOLIC PANEL
BUN/Creatinine Ratio: 18 (ref 10–24)
BUN: 20 mg/dL (ref 8–27)
CO2: 22 mmol/L (ref 20–29)
Calcium: 9.9 mg/dL (ref 8.6–10.2)
Chloride: 103 mmol/L (ref 96–106)
Creatinine, Ser: 1.1 mg/dL (ref 0.76–1.27)
GFR calc Af Amer: 84 mL/min/{1.73_m2} (ref >59)
GFR calc non Af Amer: 73 mL/min/{1.73_m2} (ref >59)
Glucose: 96 mg/dL (ref 65–99)
Potassium: 5.1 mmol/L (ref 3.5–5.2)
Sodium: 142 mmol/L (ref 134–144)

## 2018-11-17 ENCOUNTER — Ambulatory Visit: Payer: BC Managed Care – PPO | Admitting: Cardiology

## 2018-12-16 ENCOUNTER — Other Ambulatory Visit: Payer: Self-pay | Admitting: *Deleted

## 2018-12-16 DIAGNOSIS — I712 Thoracic aortic aneurysm, without rupture, unspecified: Secondary | ICD-10-CM

## 2018-12-17 ENCOUNTER — Other Ambulatory Visit: Payer: Self-pay | Admitting: *Deleted

## 2018-12-17 ENCOUNTER — Encounter: Payer: Self-pay | Admitting: *Deleted

## 2018-12-17 DIAGNOSIS — I712 Thoracic aortic aneurysm, without rupture, unspecified: Secondary | ICD-10-CM

## 2018-12-17 DIAGNOSIS — R072 Precordial pain: Secondary | ICD-10-CM

## 2018-12-17 NOTE — Progress Notes (Signed)
Cardiac ct

## 2018-12-28 ENCOUNTER — Encounter: Payer: Self-pay | Admitting: *Deleted

## 2018-12-28 ENCOUNTER — Telehealth: Payer: Self-pay | Admitting: Cardiology

## 2018-12-28 MED ORDER — METOPROLOL TARTRATE 100 MG PO TABS: ORAL_TABLET | ORAL | 0 refills | Status: DC

## 2018-12-28 NOTE — Telephone Encounter (Signed)
Left message for patient, instructions sent to patient through my chart. New script sent to the pharmacy  Your cardiac CT will be scheduled at one of the below locations:   [facility] [address] [place], [place] [address] [phone] [place] [address] [place], [place] [address] [phone]  Please arrive at the [facility] main entrance of [facility] 30-45 minutes prior to test start time. Proceed to the [facility] Radiology Department (first floor) to check-in and test prep.  Please follow these instructions carefully (unless otherwise directed):  Hold all erectile dysfunction medications at least 48 hours prior to test.  On the Night Before the Test: . Be sure to Drink plenty of water. . Do not consume any caffeinated/decaffeinated beverages or chocolate 12 hours prior to your test. . Do not take any antihistamines 12 hours prior to your test. . If you take Metformin do not take 24 hours prior to test. . If the patient has contrast allergy: ? Patient will need a prescription for Prednisone and very clear instructions (as follows): 1. Prednisone 50 mg - take 13 hours prior to test 2. Take another Prednisone 50 mg 7 hours prior to test 3. Take another Prednisone 50 mg 1 hour prior to test 4. Take Benadryl 50 mg 1 hour prior to test . Patient must complete all four doses of above prophylactic medications. . Patient will need a ride after test due to Benadryl.  On the Day of the Test: . Drink plenty of water. Do not drink any water within one hour of the test. . Do not eat any food 4 hours prior to the test. . You may take your regular medications prior to the test.  . Take metoprolol (Lopressor) two hours prior to test. . HOLD Furosemide/Hydrochlorothiazide morning of the test. . FEMALES- please wear underwire-free bra if available      After the Test: . Drink plenty of  water. . After receiving IV contrast, you may experience a mild flushed feeling. This is normal. . On occasion, you may experience a mild rash up to 24 hours after the test. This is not dangerous. If this occurs, you can take Benadryl 25 mg and increase your fluid intake. . If you experience trouble breathing, this can be serious. If it is severe call 911 IMMEDIATELY. If it is mild, please call our office. . If you take any of these medications: Glipizide/Metformin, Avandament, Glucavance, please do not take 48 hours after completing test.    Please contact the cardiac imaging nurse navigator should you have any questions/concerns Marchia Bond, RN Navigator Cardiac [place] [facility] [phone] Office  [phone] Cell

## 2018-12-28 NOTE — Telephone Encounter (Signed)
°  Patient would like to know if he needs to be NPO for his test tomorrow, and if he needs to hold any medications.   Please advise

## 2018-12-29 ENCOUNTER — Other Ambulatory Visit: Payer: Self-pay

## 2018-12-29 ENCOUNTER — Ambulatory Visit (HOSPITAL_COMMUNITY)
Admission: RE | Admit: 2018-12-29 | Discharge: 2018-12-29 | Disposition: A | Discharge status: Home / Self Care | Payer: BC Managed Care – PPO | Source: Ambulatory Visit | Attending: Cardiology | Admitting: Cardiology

## 2018-12-29 DIAGNOSIS — I712 Thoracic aortic aneurysm, without rupture, unspecified: Secondary | ICD-10-CM

## 2018-12-29 DIAGNOSIS — I700 Atherosclerosis of aorta: Secondary | ICD-10-CM | POA: Diagnosis not present

## 2018-12-29 DIAGNOSIS — R072 Precordial pain: Secondary | ICD-10-CM | POA: Insufficient documentation

## 2018-12-29 MED ORDER — NITROGLYCERIN 0.4 MG SL SUBL
SUBLINGUAL_TABLET | SUBLINGUAL | Status: AC
  Administered 2018-12-29: 0.8 mg
  Filled 2018-12-29: qty 2

## 2018-12-29 MED ORDER — NITROGLYCERIN 0.4 MG SL SUBL: 0.8000 mg | SUBLINGUAL_TABLET | Freq: Once | SUBLINGUAL | Status: DC

## 2018-12-29 MED ORDER — IOHEXOL 350 MG/ML SOLN
80.0000 mL | Freq: Once | INTRAVENOUS | Status: AC | PRN
  Administered 2018-12-29: 09:00:00 80 mL via INTRAVENOUS

## 2019-01-12 ENCOUNTER — Other Ambulatory Visit: Payer: BC Managed Care – PPO

## 2019-01-12 NOTE — Progress Notes (Signed)
Virtual Visit via Video Note.   This visit type was conducted due to national recommendations for restrictions regarding the COVID-19 Pandemic (e.g. social distancing) in an effort to limit this patient's exposure and mitigate transmission in our community.  Due to his co-morbid illnesses, this patient is at least at moderate risk for complications without adequate follow up.  This format is felt to be most appropriate for this patient at this time.  All issues noted in this document were discussed and addressed.  A limited physical exam was performed with this format.  Please refer to the patient's chart for his consent to telehealth for [facility].    HPI: FU CP and TAA. Patient seen in the emergency room2/20with chest pain. Echocardiogram February 2020 showed normal LV function, mild diastolic dysfunction, trileaflet aortic valve;dilated ascending aorta at 49 mm. Exercise treadmill February 2020 negative.CTA February 2020 showed 4.8 cm ascending thoracicaortic aneurysm. Cardiac CTA August 2020 showed mild nonobstructive mixed plaque in the distal left main and proximal LAD, coronary calcium score of 4.58 and thoracic aortic aneurysm measuring 4.6 cm.  Since last seen,he occasionally feels mild chest discomfort when stressed.  He denies dyspnea, palpitations or syncope.  Current Outpatient Medications  Medication Sig Dispense Refill  . Ascorbic Acid (VITAMIN C) 1000 MG tablet Take 1,000 mg by mouth daily.    Marland Kitchen aspirin 81 MG chewable tablet Chew 1 tablet (81 mg total) by mouth daily. 30 tablet 0  . Cholecalciferol (VITAMIN D3 PO) Take 100 mcg by mouth daily.    Marland Kitchen losartan (COZAAR) 25 MG tablet Take 1 tablet (25 mg total) by mouth daily. 90 tablet 3  . Melatonin 3 MG TABS Take 3 mg by mouth at bedtime.    . metoprolol tartrate (LOPRESSOR) 100 MG tablet Take 2 hours prior to scan 1 tablet 0  . Multiple Vitamin (MULTIVITAMIN WITH MINERALS) TABS tablet Take 1 tablet by mouth daily.     . naproxen sodium (ALEVE) 220 MG tablet Take 440 mg by mouth daily.    . Omega-3 Fatty Acids (EQL OMEGA 3 FISH OIL) 1400 MG CAPS Take 1,400 mg by mouth daily.    . pravastatin (PRAVACHOL) 10 MG tablet Take 10 mg by mouth daily.     No current facility-administered medications for this visit.      Past Medical History:  Diagnosis Date  . Hyperlipidemia     Past Surgical History:  Procedure Laterality Date  . FISTULOTOMY  2000    Social History   Socioeconomic History  . Marital status: Married    Spouse name: Not on file  . Number of children: 2  . Years of education: Not on file  . Highest education level: Not on file  Occupational History    Comment: Sales  Social Needs  . Financial resource strain: Not on file  . Food insecurity    Worry: Not on file    Inability: Not on file  . Transportation needs    Medical: Not on file    Non-medical: Not on file  Tobacco Use  . Smoking status: Never Smoker  . Smokeless tobacco: Never Used  Substance and Sexual Activity  . Alcohol use: Yes    Comment: socially  . Drug use: Never  . Sexual activity: Not on file  Lifestyle  . Physical activity    Days per week: Not on file    Minutes per session: Not on file  . Stress: Not on file  Relationships  .  Social Musicianconnections    Talks on phone: Not on file    Gets together: Not on file    Attends religious service: Not on file    Active member of club or organization: Not on file    Attends meetings of clubs or organizations: Not on file    Relationship status: Not on file  . Intimate partner violence    Fear of current or ex partner: Not on file    Emotionally abused: Not on file    Physically abused: Not on file    Forced sexual activity: Not on file  Other Topics Concern  . Not on file  Social History Narrative  . Not on file    Family History  Problem Relation Age of Onset  . Alzheimer's disease Mother   . CVA Father   . Anxiety disorder Brother     ROS: no  fevers or chills, productive cough, hemoptysis, dysphasia, odynophagia, melena, hematochezia, dysuria, hematuria, rash, seizure activity, orthopnea, PND, pedal edema, claudication. Remaining systems are negative.  Physical Exam: Normal affect  Answers questions appropriately No acute distress Remainder physical examination not performed (coronavirus pandemic)  A/P  1 thoracic aortic aneurysm-patient will need follow-up CTA August 2021.  Patient will continue to avoid isometric exercise such as weight lifting and will also avoid quinolones.  He does not have a bicuspid valve and there is no family history of dissection or aneurysm.  2 hypertension-patient's blood pressure is controlled by his report.  Continue losartan at present dose.  3 Palpitations-improved compared to previous.  We can consider a monitor in the future if they worsen.  4 hyperlipidemia-given mild coronary disease on recent CTA I will discontinue pravastatin and treat with Lipitor 40 mg daily.  Check lipids and liver in 6 weeks.  5 chest pain-previous symptoms atypical.  Recent CTA showed nonobstructive disease.  Plan medical therapy with aspirin and statin.  Olga MillersBrian Crenshaw, MD

## 2019-01-14 ENCOUNTER — Telehealth (INDEPENDENT_AMBULATORY_CARE_PROVIDER_SITE_OTHER): Payer: BC Managed Care – PPO | Admitting: Cardiology

## 2019-01-14 ENCOUNTER — Encounter: Payer: Self-pay | Admitting: Cardiology

## 2019-01-14 VITALS — Ht 70.0 in | Wt 200.0 lb

## 2019-01-14 DIAGNOSIS — E7800 Pure hypercholesterolemia, unspecified: Secondary | ICD-10-CM

## 2019-01-14 DIAGNOSIS — I712 Thoracic aortic aneurysm, without rupture, unspecified: Secondary | ICD-10-CM

## 2019-01-14 DIAGNOSIS — R072 Precordial pain: Secondary | ICD-10-CM | POA: Diagnosis not present

## 2019-01-14 MED ORDER — ATORVASTATIN CALCIUM 40 MG PO TABS: 40.0000 mg | ORAL_TABLET | Freq: Every day | ORAL | 3 refills | Status: DC

## 2019-01-14 NOTE — Patient Instructions (Signed)
Medication Instructions:  STOP PRAVASTATIN  START ATORVASTATIN 40 MG ONCE DAILY  If you need a refill on your cardiac medications before your next appointment, please call your pharmacy.   Lab work: Your physician recommends that you return for lab work in: [place]  If you have labs (blood work) drawn today and your tests are completely normal, you will receive your results only by: Marland Kitchen MyChart Message (if you have MyChart) OR . A paper copy in the mail If you have any lab test that is abnormal or we need to change your treatment, we will call you to review the results.  Follow-Up: At [facility], you and your health needs are our priority.  As part of our continuing mission to provide you with exceptional heart care, we have created designated Provider Care Teams.  These Care Teams include your primary Cardiologist (physician) and Advanced Practice Providers (APPs -  Physician Assistants and Nurse Practitioners) who all work together to provide you with the care you need, when you need it. You will need a follow up appointment in 6 months.  Please call our office 2 months in advance to schedule this appointment.  You may see Kirk Ruths MD or one of the following Advanced Practice Providers on your designated Care Team:   Kerin Ransom, PA-C Roby Lofts, [place] . Sande Rives, PA-C

## 2019-01-21 ENCOUNTER — Telehealth: Payer: Self-pay | Admitting: *Deleted

## 2019-01-21 NOTE — Telephone Encounter (Signed)
   [facility] Pre-operative Risk Assessment    Request for surgical clearance:  1. What type of surgery is being performed? colonoscopy  2. When is this surgery scheduled? 03-19-2019  3. What type of clearance is required (medical clearance vs. Pharmacy clearance to hold med vs. Both)? medical  4. Are there any medications that need to be held prior to surgery and how long? none  5. Practice name and name of physician performing surgery? Dr Penelope Coop  6. What is your office phone number [phone]   7.   What is your office fax number [phone]  8.   Anesthesia type (None, local, MAC, general) ? Not listed   Fredia Beets 01/21/2019, 11:06 AM  _________________________________________________________________   (provider comments below)

## 2019-01-26 NOTE — Telephone Encounter (Signed)
   Primary Cardiologist: Kirk Ruths, MD  Chart reviewed as part of pre-operative protocol coverage. Patient was contacted 01/26/2019 in reference to pre-operative risk assessment for pending surgery as outlined below.  Raymond Wilson was last seen on 01/14/2019 via virtual visit by Dr. Stanford Breed.  Since that day, Raymond Wilson has done well without any chest pain or shortness of breath. Recent coronary CT showed only mild coronary disease.   Therefore, based on ACC/AHA guidelines, the patient would be at acceptable risk for the planned procedure without further cardiovascular testing.   I will route this recommendation to the requesting party via Epic fax function and remove from pre-op pool.  Please call with questions.  [place], [place] 01/26/2019, 3:00 PM

## 2019-02-19 DIAGNOSIS — Z23 Encounter for immunization: Secondary | ICD-10-CM | POA: Diagnosis not present

## 2019-02-26 DIAGNOSIS — E7800 Pure hypercholesterolemia, unspecified: Secondary | ICD-10-CM | POA: Diagnosis not present

## 2019-02-26 LAB — LIPID PANEL
Chol/HDL Ratio: 2.8 ratio (ref 0.0–5.0)
Cholesterol, Total: 142 mg/dL (ref 100–199)
HDL: 50 mg/dL (ref >39)
LDL Chol Calc (NIH): 78 mg/dL (ref 0–99)
Triglycerides: 70 mg/dL (ref 0–149)
VLDL Cholesterol Cal: 14 mg/dL (ref 5–40)

## 2019-02-26 LAB — HEPATIC FUNCTION PANEL
ALT: 33 IU/L (ref 0–44)
AST: 25 IU/L (ref 0–40)
Albumin: 4.6 g/dL (ref 3.8–4.8)
Alkaline Phosphatase: 72 IU/L (ref 39–117)
Bilirubin Total: 0.4 mg/dL (ref 0.0–1.2)
Bilirubin, Direct: 0.12 mg/dL (ref 0.00–0.40)
Total Protein: 7 g/dL (ref 6.0–8.5)

## 2019-03-15 DIAGNOSIS — L111 Transient acantholytic dermatosis [Grover]: Secondary | ICD-10-CM | POA: Diagnosis not present

## 2019-03-15 DIAGNOSIS — D2339 Other benign neoplasm of skin of other parts of face: Secondary | ICD-10-CM | POA: Diagnosis not present

## 2019-03-15 DIAGNOSIS — L570 Actinic keratosis: Secondary | ICD-10-CM | POA: Diagnosis not present

## 2019-03-16 DIAGNOSIS — Z1159 Encounter for screening for other viral diseases: Secondary | ICD-10-CM | POA: Diagnosis not present

## 2019-03-19 DIAGNOSIS — Z1211 Encounter for screening for malignant neoplasm of colon: Secondary | ICD-10-CM | POA: Diagnosis not present

## 2019-05-18 DIAGNOSIS — L570 Actinic keratosis: Secondary | ICD-10-CM | POA: Diagnosis not present

## 2019-06-08 DIAGNOSIS — Z0000 Encounter for general adult medical examination without abnormal findings: Secondary | ICD-10-CM | POA: Diagnosis not present

## 2019-06-08 DIAGNOSIS — E7800 Pure hypercholesterolemia, unspecified: Secondary | ICD-10-CM | POA: Diagnosis not present

## 2019-06-08 DIAGNOSIS — Z125 Encounter for screening for malignant neoplasm of prostate: Secondary | ICD-10-CM | POA: Diagnosis not present

## 2019-06-08 DIAGNOSIS — R7301 Impaired fasting glucose: Secondary | ICD-10-CM | POA: Diagnosis not present

## 2019-06-11 DIAGNOSIS — I712 Thoracic aortic aneurysm, without rupture: Secondary | ICD-10-CM | POA: Diagnosis not present

## 2019-06-11 DIAGNOSIS — R7301 Impaired fasting glucose: Secondary | ICD-10-CM | POA: Diagnosis not present

## 2019-06-11 DIAGNOSIS — L570 Actinic keratosis: Secondary | ICD-10-CM | POA: Diagnosis not present

## 2019-06-11 DIAGNOSIS — E7800 Pure hypercholesterolemia, unspecified: Secondary | ICD-10-CM | POA: Diagnosis not present

## 2019-06-25 DIAGNOSIS — F419 Anxiety disorder, unspecified: Secondary | ICD-10-CM | POA: Diagnosis not present

## 2019-07-07 NOTE — Progress Notes (Signed)
HPI: FU CP and TAA. Patient seen in the emergency room2/20with chest pain. Echocardiogram February 2020 showed normal LV function, mild diastolic dysfunction, trileaflet aortic valve;dilated ascending aorta at 49 mm. 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 Since last seen he continues to have occasional chest pain that is not related to activities.  He walks 3 miles 4 times a week with no symptoms of chest pain.  No dyspnea, palpitations or syncope.  Current Outpatient Medications  Medication Sig Dispense Refill  . Ascorbic Acid (VITAMIN C) 1000 MG tablet Take 1,000 mg by mouth daily.    Marland Kitchen aspirin 81 MG chewable tablet Chew 1 tablet (81 mg total) by mouth daily. 30 tablet 0  . atorvastatin (LIPITOR) 40 MG tablet Take 1 tablet (40 mg total) by mouth daily. 90 tablet 3  . Cholecalciferol (VITAMIN D3 PO) Take 100 mcg by mouth daily.    Marland Kitchen losartan (COZAAR) 25 MG tablet Take 1 tablet (25 mg total) by mouth daily. 90 tablet 3  . Multiple Vitamin (MULTIVITAMIN WITH MINERALS) TABS tablet Take 1 tablet by mouth daily.    . naproxen sodium (ALEVE) 220 MG tablet Take 440 mg by mouth daily.    . Omega-3 Fatty Acids (EQL OMEGA 3 FISH OIL) 1400 MG CAPS Take 1,400 mg by mouth daily.     No current facility-administered medications for this visit.     Past Medical History:  Diagnosis Date  . Hyperlipidemia     Past Surgical History:  Procedure Laterality Date  . FISTULOTOMY  2000    Social History   Socioeconomic History  . Marital status: Married    Spouse name: Not on file  . Number of children: 2  . Years of education: Not on file  . Highest education level: Not on file  Occupational History    Comment: Sales  Tobacco Use  .  Smoking status: Never Smoker  . Smokeless tobacco: Never Used  Substance and Sexual Activity  . Alcohol use: Yes    Comment: socially  . Drug use: Never  . Sexual activity: Not on file  Other Topics Concern  . Not on file  Social History Narrative  . Not on file   Social Determinants of Health   Financial Resource Strain:   . Difficulty of Paying Living Expenses: Not on file  Food Insecurity:   . Worried About Programme researcher, broadcasting/film/video in the Last Year: Not on file  . Ran Out of Food in the Last Year: Not on file  Transportation Needs:   . Lack of Transportation (Medical): Not on file  . Lack of Transportation (Non-Medical): Not on file  Physical Activity:   . Days of Exercise per Week: Not on file  . Minutes of Exercise per Session: Not on file  Stress:   . Feeling of Stress : Not on file  Social Connections:   . Frequency of Communication with Friends and Family: Not on file  . Frequency of Social Gatherings with Friends and Family: Not on file  . Attends Religious Services: Not on file  . Active Member of Clubs or Organizations: Not on file  . Attends Banker Meetings: Not on file  . Marital Status: Not on file  Intimate Partner Violence:   . Fear  of Current or Ex-Partner: Not on file  . Emotionally Abused: Not on file  . Physically Abused: Not on file  . Sexually Abused: Not on file    Family History  Problem Relation Age of Onset  . Alzheimer's disease Mother   . CVA Father   . Anxiety disorder Brother     ROS: no fevers or chills, productive cough, hemoptysis, dysphasia, odynophagia, melena, hematochezia, dysuria, hematuria, rash, seizure activity, orthopnea, PND, pedal edema, claudication. Remaining systems are negative.  Physical Exam: Well-developed well-nourished in no acute distress.  Skin is warm and dry.  HEENT is normal.  Neck is supple.  Chest is clear to auscultation with normal expansion.  Cardiovascular exam is regular rate and rhythm.   Abdominal exam nontender or distended. No masses palpated. Extremities show no edema. neuro grossly intact  ECG-sinus rhythm at a rate of 70, no ST changes.  Personally reviewed  A/P  1 aortic aneurysm-plan follow-up chest CTA August 2021.  As outlined previously he does not have a bicuspid valve.  There is no family history of dissection or aneurysm.  Avoid quinolones.  2 hypertension-blood pressure controlled.  Continue losartan.  3 hyperlipidemia-continue Lipitor-present dose.  4 palpitations-patient has not had recurrent symptoms.  We will consider a beta-blocker in the future if needed as well as a monitor.    5 history of chest pain-electrocardiogram shows no ST changes.  Previous CTA showed nonobstructive disease.  Continue aspirin and statin.  Kirk Ruths, MD

## 2019-07-13 ENCOUNTER — Ambulatory Visit: Payer: BC Managed Care – PPO | Admitting: Cardiology

## 2019-07-13 ENCOUNTER — Other Ambulatory Visit: Payer: Self-pay

## 2019-07-13 ENCOUNTER — Encounter: Payer: Self-pay | Admitting: Cardiology

## 2019-07-13 VITALS — BP 122/72 | HR 70 | Ht 70.0 in | Wt 201.8 lb

## 2019-07-13 DIAGNOSIS — I712 Thoracic aortic aneurysm, without rupture, unspecified: Secondary | ICD-10-CM

## 2019-07-13 DIAGNOSIS — I10 Essential (primary) hypertension: Secondary | ICD-10-CM

## 2019-07-13 DIAGNOSIS — E7800 Pure hypercholesterolemia, unspecified: Secondary | ICD-10-CM

## 2019-07-13 DIAGNOSIS — R072 Precordial pain: Secondary | ICD-10-CM | POA: Diagnosis not present

## 2019-07-13 NOTE — Patient Instructions (Signed)

## 2019-07-19 NOTE — Addendum Note (Signed)
Addended by: Chana Bode on: 07/19/2019 12:09 PM   Modules accepted: Orders

## 2019-08-25 DIAGNOSIS — Z0000 Encounter for general adult medical examination without abnormal findings: Secondary | ICD-10-CM | POA: Diagnosis not present

## 2019-08-26 ENCOUNTER — Other Ambulatory Visit: Payer: Self-pay | Admitting: Cardiology

## 2019-10-20 ENCOUNTER — Encounter: Payer: Self-pay | Admitting: *Deleted

## 2019-10-20 ENCOUNTER — Other Ambulatory Visit: Payer: Self-pay | Admitting: *Deleted

## 2019-10-20 DIAGNOSIS — I712 Thoracic aortic aneurysm, without rupture, unspecified: Secondary | ICD-10-CM

## 2019-11-15 ENCOUNTER — Other Ambulatory Visit: Payer: Self-pay | Admitting: *Deleted

## 2019-11-15 DIAGNOSIS — I10 Essential (primary) hypertension: Secondary | ICD-10-CM

## 2019-11-15 NOTE — Telephone Encounter (Signed)
Patty Sermons  Do you know who schedules Dr Jens Som patients in [place] for Ct Angio?   Thanks  Carney Bern

## 2019-11-16 DIAGNOSIS — Z789 Other specified health status: Secondary | ICD-10-CM | POA: Diagnosis not present

## 2019-11-16 DIAGNOSIS — W57XXXA Bitten or stung by nonvenomous insect and other nonvenomous arthropods, initial encounter: Secondary | ICD-10-CM | POA: Diagnosis not present

## 2019-11-16 DIAGNOSIS — S60561A Insect bite (nonvenomous) of right hand, initial encounter: Secondary | ICD-10-CM | POA: Diagnosis not present

## 2019-11-16 DIAGNOSIS — L089 Local infection of the skin and subcutaneous tissue, unspecified: Secondary | ICD-10-CM | POA: Diagnosis not present

## 2019-12-10 DIAGNOSIS — I10 Essential (primary) hypertension: Secondary | ICD-10-CM | POA: Diagnosis not present

## 2019-12-10 LAB — BASIC METABOLIC PANEL
BUN/Creatinine Ratio: 16 (ref 10–24)
BUN: 16 mg/dL (ref 8–27)
CO2: 22 mmol/L (ref 20–29)
Calcium: 9.8 mg/dL (ref 8.6–10.2)
Chloride: 102 mmol/L (ref 96–106)
Creatinine, Ser: 0.99 mg/dL (ref 0.76–1.27)
GFR calc Af Amer: 94 mL/min/{1.73_m2} (ref >59)
GFR calc non Af Amer: 81 mL/min/{1.73_m2} (ref >59)
Glucose: 105 mg/dL — ABNORMAL HIGH (ref 65–99)
Potassium: 5.1 mmol/L (ref 3.5–5.2)
Sodium: 138 mmol/L (ref 134–144)

## 2019-12-30 ENCOUNTER — Ambulatory Visit (INDEPENDENT_AMBULATORY_CARE_PROVIDER_SITE_OTHER): Payer: BC Managed Care – PPO

## 2019-12-30 ENCOUNTER — Other Ambulatory Visit: Payer: Self-pay

## 2019-12-30 DIAGNOSIS — I700 Atherosclerosis of aorta: Secondary | ICD-10-CM | POA: Diagnosis not present

## 2019-12-30 DIAGNOSIS — I712 Thoracic aortic aneurysm, without rupture, unspecified: Secondary | ICD-10-CM

## 2019-12-30 DIAGNOSIS — R918 Other nonspecific abnormal finding of lung field: Secondary | ICD-10-CM | POA: Diagnosis not present

## 2019-12-30 MED ORDER — IOHEXOL 350 MG/ML SOLN
100.0000 mL | Freq: Once | INTRAVENOUS | Status: AC | PRN
  Administered 2019-12-30: 100 mL via INTRAVENOUS

## 2019-12-31 ENCOUNTER — Other Ambulatory Visit: Payer: Self-pay | Admitting: Cardiology

## 2019-12-31 DIAGNOSIS — E7800 Pure hypercholesterolemia, unspecified: Secondary | ICD-10-CM

## 2020-01-07 NOTE — Progress Notes (Signed)
Virtual Visit via Video Note   This visit type was conducted due to national recommendations for restrictions regarding the COVID-19 Pandemic (e.g. social distancing) in an effort to limit this patient's exposure and mitigate transmission in our community.  Due to his co-morbid illnesses, this patient is at least at moderate risk for complications without adequate follow up.  This format is felt to be most appropriate for this patient at this time.  All issues noted in this document were discussed and addressed.  A limited physical exam was performed with this format.  Please refer to the patient's chart for his consent to telehealth for [facility].      Date:  01/12/2020   ID:  Raymond Wilson, DOB March 11, 1956, MRN [id number]  Patient Location:Home Provider Location: Home  PCP:  Catha Gosselin, MD  Cardiologist:  Dr Jens Som  Evaluation Performed:  Follow-Up Visit  Chief Complaint:  FU TAA  History of Present Illness:    FU CP and TAA. Patient seen in the emergency room2/20with chest pain. Echocardiogram February 2020 showed normal LV function, mild diastolic dysfunction, trileaflet aortic valve;dilated ascending aorta at 49 mm. Exercise treadmill February 2020 negative.Cardiac CTA August 2020 showed mild nonobstructive mixed plaque in the distal left main and proximal LAD, coronary calcium score of 4.58 and thoracic aortic aneurysm measuring 4.6 cm.  Last CTA August 2021 showed stable 4.5 cm ascending thoracic aortic aneurysm.  Since last seen  patient denies dyspnea, chest pain, palpitations or syncope.  His blood pressure was running low with associated dizziness and he discontinued his ARB.  The symptoms have now improved.  He is exercising routinely and has lost 20 pounds.  The patient does not have symptoms concerning for COVID-19 infection (fever, chills, cough, or new shortness of breath).    Past Medical History:  Diagnosis Date  . Hyperlipidemia    Past Surgical  History:  Procedure Laterality Date  . FISTULOTOMY  2000     Current Meds  Medication Sig  . Ascorbic Acid (VITAMIN C) 1000 MG tablet Take 1,000 mg by mouth daily.  Marland Kitchen aspirin 81 MG chewable tablet Chew 1 tablet (81 mg total) by mouth daily.  Marland Kitchen atorvastatin (LIPITOR) 40 MG tablet TAKE 1 TABLET BY MOUTH EVERY DAY  . Cholecalciferol (VITAMIN D3 PO) Take 100 mcg by mouth daily.  Marland Kitchen escitalopram (LEXAPRO) 10 MG tablet Take 10 mg by mouth daily.  . Melatonin 10 MG CAPS Take 1 capsule by mouth at bedtime.  . Multiple Vitamin (MULTIVITAMIN WITH MINERALS) TABS tablet Take 1 tablet by mouth daily.  . naproxen sodium (ALEVE) 220 MG tablet Take 440 mg by mouth daily.  . Omega-3 Fatty Acids (EQL OMEGA 3 FISH OIL) 1400 MG CAPS Take 1,400 mg by mouth daily.     Allergies:   Patient has no known allergies.   Social History   Tobacco Use  . Smoking status: Never Smoker  . Smokeless tobacco: Never Used  Substance Use Topics  . Alcohol use: Yes    Comment: socially  . Drug use: Never     Family Hx: The patient's family history includes Alzheimer's disease in his mother; Anxiety disorder in his brother; CVA in his father.  ROS:   Please see the history of present illness.    No Fever, chills  or productive cough All other systems reviewed and are negative.  Recent Labs: 02/26/2019: ALT 33 12/10/2019: BUN 16; Creatinine, Ser 0.99; Potassium 5.1; Sodium 138   Recent Lipid Panel Lab  Results  Component Value Date/Time   CHOL 142 02/26/2019 08:45 AM   TRIG 70 02/26/2019 08:45 AM   HDL 50 02/26/2019 08:45 AM   CHOLHDL 2.8 02/26/2019 08:45 AM   LDLCALC 78 02/26/2019 08:45 AM    Wt Readings from Last 3 Encounters:  01/12/20 186 lb (84.4 kg)  07/13/19 201 lb 12.8 oz (91.5 kg)  01/14/19 200 lb (90.7 kg)     Objective:    Vital Signs:  BP 106/69   Pulse 68   Ht 5\' 10"  (1.778 m)   Wt 186 lb (84.4 kg)   BMI 26.69 kg/m    VITAL SIGNS:  reviewed NAD Answers questions  appropriately Normal affect Remainder of physical examination not performed (telehealth visit; coronavirus pandemic)  ASSESSMENT & PLAN:    1. Thoracic aortic aneurysm-plan repeat CTA Feb 2022; if size unchanged at that time will plan yearly CTAs. 2. Hypertension-blood pressure has been borderline and therefore losartan discontinued. Will follow 3. Hyperlipidemia-continue statin. 4. Palpitations-patient has had no recurrences.  We can add a beta-blocker in the future if necessary. 5. History of chest pain-patient has not had recurrences.  Previous CTA showed no obstructive coronary disease.  Calcium score mildly elevated.  Continue aspirin and statin.  COVID-19 Education: The importance of social distancing was discussed today.  Time:   Today, I have spent 15 minutes with the patient with telehealth technology discussing the above problems.     Medication Adjustments/Labs and Tests Ordered: Current medicines are reviewed at length with the patient today.  Concerns regarding medicines are outlined above.   Tests Ordered: No orders of the defined types were placed in this encounter.   Medication Changes: No orders of the defined types were placed in this encounter.   Follow Up:  In person in 1 year(s)  Signed, Mar 2022, MD  01/12/2020 7:51 AM    [facility]

## 2020-01-12 ENCOUNTER — Encounter: Payer: Self-pay | Admitting: Cardiology

## 2020-01-12 ENCOUNTER — Telehealth (INDEPENDENT_AMBULATORY_CARE_PROVIDER_SITE_OTHER): Payer: BC Managed Care – PPO | Admitting: Cardiology

## 2020-01-12 VITALS — BP 106/69 | HR 68 | Ht 70.0 in | Wt 186.0 lb

## 2020-01-12 DIAGNOSIS — R072 Precordial pain: Secondary | ICD-10-CM | POA: Diagnosis not present

## 2020-01-12 DIAGNOSIS — I712 Thoracic aortic aneurysm, without rupture, unspecified: Secondary | ICD-10-CM

## 2020-01-12 DIAGNOSIS — I10 Essential (primary) hypertension: Secondary | ICD-10-CM

## 2020-01-12 DIAGNOSIS — E7800 Pure hypercholesterolemia, unspecified: Secondary | ICD-10-CM | POA: Diagnosis not present

## 2020-01-12 NOTE — Patient Instructions (Signed)

## 2020-06-01 ENCOUNTER — Encounter: Payer: Self-pay | Admitting: *Deleted

## 2020-06-01 ENCOUNTER — Other Ambulatory Visit: Payer: Self-pay | Admitting: *Deleted

## 2020-06-01 DIAGNOSIS — I712 Thoracic aortic aneurysm, without rupture, unspecified: Secondary | ICD-10-CM

## 2020-06-03 ENCOUNTER — Other Ambulatory Visit: Payer: BC Managed Care – PPO

## 2020-06-05 ENCOUNTER — Telehealth: Payer: Self-pay | Admitting: Cardiology

## 2020-06-05 NOTE — Telephone Encounter (Signed)
Spoke with patient regarding preferred scheduling dates and times for the CTA chest aorta ordered by Dr. Jens Som.  Patient states Fridays are best for him.  Informed patient I will call and schedule the appointment and call him back.[facility] if closed today 06/05/20 for the holiday.  Will call back tomorrow.

## 2020-06-07 NOTE — Telephone Encounter (Signed)
Spoke with patient regarding scheduled appointment 06/23/20 at 12:20 pm for the CTA chest/aorta ordered by Dr. Jens Som.  Arrival time is 12:00pm at [facility] --[address] for check in---Liquids only 4 hours prior to study.  Patient voiced his understanding.

## 2020-06-23 ENCOUNTER — Ambulatory Visit
Admission: RE | Admit: 2020-06-23 | Discharge: 2020-06-23 | Disposition: A | Discharge status: Home / Self Care | Payer: BC Managed Care – PPO | Source: Ambulatory Visit | Attending: Cardiology | Admitting: Cardiology

## 2020-06-23 DIAGNOSIS — I712 Thoracic aortic aneurysm, without rupture, unspecified: Secondary | ICD-10-CM

## 2020-06-23 MED ORDER — IOPAMIDOL (ISOVUE-370) INJECTION 76%
75.0000 mL | Freq: Once | INTRAVENOUS | Status: AC | PRN
  Administered 2020-06-23: 75 mL via INTRAVENOUS

## 2020-07-14 NOTE — Progress Notes (Signed)
HPI: FU CP and TAA. Patient seen in the emergency room2/20with chest pain. Echocardiogram February 2020 showed normal LV function, mild diastolic dysfunction, trileaflet aortic valve;dilated ascending aorta at 49 mm. Exercise treadmill February 2020 negative.Cardiac CTA August 2020 showed mild nonobstructive mixed plaque in the distal left main and proximal LAD, coronary calcium score of 4.58 and thoracic aortic aneurysm measuring 4.6 cm. Last CTA February 2022 showed 4.7 cm ascending thoracic aortic aneurysm.  Since last seen the patient denies any dyspnea on exertion, orthopnea, PND, pedal edema, palpitations, syncope or chest pain.   Current Outpatient Medications  Medication Sig Dispense Refill  . Ascorbic Acid (VITAMIN C) 1000 MG tablet Take 1,000 mg by mouth daily.    Marland Kitchen aspirin 81 MG chewable tablet Chew 1 tablet (81 mg total) by mouth daily. 30 tablet 0  . atorvastatin (LIPITOR) 40 MG tablet TAKE 1 TABLET BY MOUTH EVERY DAY 90 tablet 3  . Cholecalciferol (VITAMIN D3 PO) Take 100 mcg by mouth daily.    Marland Kitchen escitalopram (LEXAPRO) 10 MG tablet Take 10 mg by mouth daily.    . Melatonin 10 MG CAPS Take 1 capsule by mouth at bedtime.    . Multiple Vitamin (MULTIVITAMIN WITH MINERALS) TABS tablet Take 1 tablet by mouth daily.    . naproxen sodium (ALEVE) 220 MG tablet Take 440 mg by mouth daily.    . Omega-3 Fatty Acids (EQL OMEGA 3 FISH OIL) 1400 MG CAPS Take 1,400 mg by mouth daily.     No current facility-administered medications for this visit.     Past Medical History:  Diagnosis Date  . Hyperlipidemia     Past Surgical History:  Procedure Laterality Date  . FISTULOTOMY  2000    Social History   Socioeconomic History  . Marital status: Married    Spouse name: Not on file  . Number of children: 2  . Years of education: Not on file  . Highest education level: Not on file  Occupational History    Comment: Sales  Tobacco Use  . Smoking status: Never Smoker  .  Smokeless tobacco: Never Used  Substance and Sexual Activity  . Alcohol use: Yes    Comment: socially  . Drug use: Never  . Sexual activity: Not on file  Other Topics Concern  . Not on file  Social History Narrative  . Not on file   Social Determinants of Health   Financial Resource Strain: Not on file  Food Insecurity: Not on file  Transportation Needs: Not on file  Physical Activity: Not on file  Stress: Not on file  Social Connections: Not on file  Intimate Partner Violence: Not on file    Family History  Problem Relation Age of Onset  . Alzheimer's disease Mother   . CVA Father   . Anxiety disorder Brother     ROS: no fevers or chills, productive cough, hemoptysis, dysphasia, odynophagia, melena, hematochezia, dysuria, hematuria, rash, seizure activity, orthopnea, PND, pedal edema, claudication. Remaining systems are negative.  Physical Exam: Well-developed well-nourished in no acute distress.  Skin is warm and dry.  HEENT is normal.  Neck is supple.  Chest is clear to auscultation with normal expansion.  Cardiovascular exam is regular rate and rhythm.  Abdominal exam nontender or distended. No masses palpated. Extremities show no edema. neuro grossly intact  ECG- personally reviewed  A/P  1 thoracic aortic aneurysm-we will arrange follow-up CTA August 2022.  2 hypertension-blood pressure controlled.  Continue present medications  and follow.  3 hyperlipidemia-continue statin.  4 history of palpitations-patient has had no recurrences.  We will consider addition of beta-blockade in the future if needed.  5 coronary artery disease-mild on previous cardiac CTA.  Continue aspirin and statin.  He is not having chest pain.  Olga Millers, MD

## 2020-07-24 ENCOUNTER — Ambulatory Visit: Payer: BC Managed Care – PPO | Admitting: Cardiology

## 2020-07-24 ENCOUNTER — Encounter: Payer: Self-pay | Admitting: Cardiology

## 2020-07-24 ENCOUNTER — Other Ambulatory Visit: Payer: Self-pay

## 2020-07-24 VITALS — BP 132/90 | HR 72 | Ht 70.0 in | Wt 206.0 lb

## 2020-07-24 DIAGNOSIS — I10 Essential (primary) hypertension: Secondary | ICD-10-CM

## 2020-07-24 DIAGNOSIS — E7800 Pure hypercholesterolemia, unspecified: Secondary | ICD-10-CM | POA: Diagnosis not present

## 2020-07-24 DIAGNOSIS — I712 Thoracic aortic aneurysm, without rupture, unspecified: Secondary | ICD-10-CM

## 2020-07-24 NOTE — Patient Instructions (Signed)

## 2020-12-07 DIAGNOSIS — I712 Thoracic aortic aneurysm, without rupture, unspecified: Secondary | ICD-10-CM

## 2020-12-26 ENCOUNTER — Other Ambulatory Visit: Payer: Self-pay | Admitting: Cardiology

## 2020-12-26 DIAGNOSIS — E7800 Pure hypercholesterolemia, unspecified: Secondary | ICD-10-CM

## 2021-03-01 ENCOUNTER — Other Ambulatory Visit: Payer: BC Managed Care – PPO

## 2021-03-02 ENCOUNTER — Other Ambulatory Visit: Payer: Self-pay

## 2021-03-02 ENCOUNTER — Ambulatory Visit
Admission: RE | Admit: 2021-03-02 | Discharge: 2021-03-02 | Disposition: A | Discharge status: Home / Self Care | Payer: BC Managed Care – PPO | Source: Ambulatory Visit | Attending: Cardiology | Admitting: Cardiology

## 2021-03-02 DIAGNOSIS — I712 Thoracic aortic aneurysm, without rupture, unspecified: Secondary | ICD-10-CM

## 2021-03-02 MED ORDER — IOPAMIDOL (ISOVUE-370) INJECTION 76%
75.0000 mL | Freq: Once | INTRAVENOUS | Status: AC | PRN
  Administered 2021-03-02: 75 mL via INTRAVENOUS

## 2021-08-02 NOTE — Progress Notes (Signed)
? ? ? ?HPI:FU TAA. Patient seen in the emergency room 2/20 with chest pain. Echocardiogram February 2020 showed normal LV function, mild diastolic dysfunction, trileaflet aortic valve; dilated ascending aorta at 49 mm.  Exercise treadmill February 2020 negative. Cardiac CTA August 2020 showed mild nonobstructive mixed plaque in the distal left main and proximal LAD, coronary calcium score of 4.58 and thoracic aortic aneurysm measuring 4.6 cm. Last CTA 10/22 showed 4.7 cm ascending thoracic aortic aneurysm.  Since last seen there is no dyspnea, chest pain, palpitations or syncope. ? ?Current Outpatient Medications  ?Medication Sig Dispense Refill  ? Ascorbic Acid (VITAMIN C) 1000 MG tablet Take 1,000 mg by mouth daily.    ? aspirin 81 MG chewable tablet Chew 1 tablet (81 mg total) by mouth daily. 30 tablet 0  ? atorvastatin (LIPITOR) 40 MG tablet TAKE 1 TABLET BY MOUTH EVERY DAY 90 tablet 3  ? Cholecalciferol (VITAMIN D3 PO) Take 100 mcg by mouth daily.    ? escitalopram (LEXAPRO) 10 MG tablet Take 10 mg by mouth daily.    ? Melatonin 10 MG CAPS Take 1 capsule by mouth at bedtime.    ? Multiple Vitamin (MULTIVITAMIN WITH MINERALS) TABS tablet Take 1 tablet by mouth daily.    ? naproxen sodium (ALEVE) 220 MG tablet Take 440 mg by mouth daily.    ? Omega-3 Fatty Acids (EQL OMEGA 3 FISH OIL) 1400 MG CAPS Take 1,400 mg by mouth daily.    ? ?No current facility-administered medications for this visit.  ? ? ? ?Past Medical History:  ?Diagnosis Date  ? Hyperlipidemia   ? ? ?Past Surgical History:  ?Procedure Laterality Date  ? FISTULOTOMY  2000  ? ? ?Social History  ? ?Socioeconomic History  ? Marital status: Married  ?  Spouse name: Not on file  ? Number of children: 2  ? Years of education: Not on file  ? 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 Smoking status: Never  ? Smokeless tobacco: Never  ?Substance and Sexual Activity  ? Alcohol use: Yes  ?  Comment: socially   ? Drug use: Never  ? Sexual activity: Not on file  ?Other Topics Concern  ? Not on file  ?Social History Narrative  ? Not on file  ? ?Social Determinants of Health  ? ?Financial Resource Strain: Not on file  ?Food Insecurity: Not on file  ?Transportation Needs: Not on file  ?Physical Activity: Not on file  ?Stress: Not on file  ?Social Connections: Not on file  ?Intimate Partner Violence: Not on file  ? ? ?Family History  ?Problem Relation Age of Onset  ? Alzheimer's disease Mother   ? CVA Father   ? Anxiety disorder Brother   ? ? ?ROS: no fevers or chills, productive cough, hemoptysis, dysphasia, odynophagia, melena, hematochezia, dysuria, hematuria, rash, seizure activity, orthopnea, PND, pedal edema, claudication. Remaining systems are negative. ? ?Physical Exam: ?Well-developed well-nourished in no acute distress.  ?Skin is warm and dry.  ?HEENT is normal.  ?Neck is supple.  ?Chest is clear to auscultation with normal expansion.  ?Cardiovascular exam is regular rate and rhythm.  ?Abdominal exam nontender or distended. No masses palpated. ?Extremities show no edema. ?neuro grossly intact ? ?ECG-normal sinus rhythm at a rate of 73, no ST changes.  Personally reviewed ? ?A/P ? ?1 thoracic aortic aneurysm-unchanged in size on most recent CTA.  Plan is for repeat study October 2023.  He understands to avoid heavy lifting and quinolones. ? ?2 hypertension-blood pressure controlled on no medications. ? ?3 hyperlipidemia-continue statin.  Lipids and liver monitored by primary care. ? ?4 coronary artery disease-noted on previous cardiac CTA (mild).  No chest pain.  Continue medical therapy with aspirin and statin. ? ?5 previous palpitations-no recurrences. ? ?Olga Millers, MD ? ? ? ?

## 2021-08-10 ENCOUNTER — Encounter: Payer: Self-pay | Admitting: Cardiology

## 2021-08-10 ENCOUNTER — Other Ambulatory Visit: Payer: Self-pay

## 2021-08-10 ENCOUNTER — Ambulatory Visit: Payer: BC Managed Care – PPO | Admitting: Cardiology

## 2021-08-10 VITALS — BP 126/80 | HR 73 | Ht 70.0 in | Wt 204.0 lb

## 2021-08-10 DIAGNOSIS — I2510 Atherosclerotic heart disease of native coronary artery without angina pectoris: Secondary | ICD-10-CM | POA: Diagnosis not present

## 2021-08-10 DIAGNOSIS — I10 Essential (primary) hypertension: Secondary | ICD-10-CM

## 2021-08-10 DIAGNOSIS — I712 Thoracic aortic aneurysm, without rupture, unspecified: Secondary | ICD-10-CM

## 2021-08-10 DIAGNOSIS — E7800 Pure hypercholesterolemia, unspecified: Secondary | ICD-10-CM

## 2021-08-10 NOTE — Patient Instructions (Signed)

## 2021-10-16 DIAGNOSIS — R7301 Impaired fasting glucose: Secondary | ICD-10-CM | POA: Diagnosis not present

## 2021-10-16 DIAGNOSIS — I10 Essential (primary) hypertension: Secondary | ICD-10-CM | POA: Diagnosis not present

## 2021-10-16 DIAGNOSIS — Z125 Encounter for screening for malignant neoplasm of prostate: Secondary | ICD-10-CM | POA: Diagnosis not present

## 2021-10-16 DIAGNOSIS — E7800 Pure hypercholesterolemia, unspecified: Secondary | ICD-10-CM | POA: Diagnosis not present

## 2021-10-26 DIAGNOSIS — Z0000 Encounter for general adult medical examination without abnormal findings: Secondary | ICD-10-CM | POA: Diagnosis not present

## 2021-11-08 ENCOUNTER — Encounter: Payer: Self-pay | Admitting: *Deleted

## 2021-11-24 IMAGING — CT CT ANGIO CHEST
2 of 6 series · 18 of 46 positions shown · IV contrast (APPLIED)
Comparison: 12/29/2018 and 07/10/2018

CLINICAL DATA: Follow-up thoracic aortic aneurysm.

EXAM:
CT ANGIOGRAPHY CHEST WITH CONTRAST
TECHNIQUE: Multidetector CT imaging of the chest was performed using the
standard protocol during bolus administration of intravenous
contrast. Multiplanar CT image reconstructions and MIPs were
obtained to evaluate the vascular anatomy.
CONTRAST:  100mL OMNIPAQUE IOHEXOL 350 MG/ML SOLN

[Series 4: axial arterial · axial · arterial · 0.76mm/px · z∈[-432,-74]mm · 15 of 135 slices shown]
[im 8/135  lung]
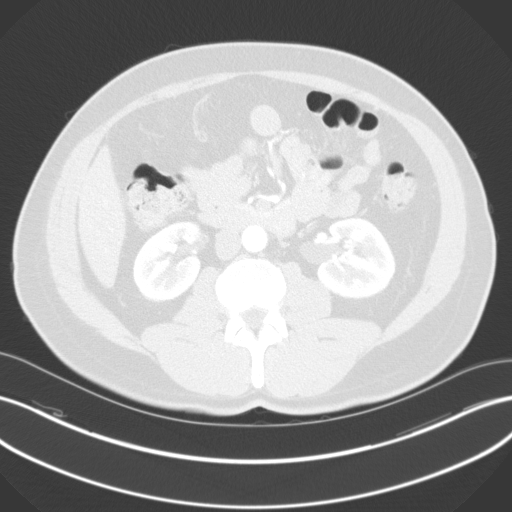
[im 15/135  soft-tissue]
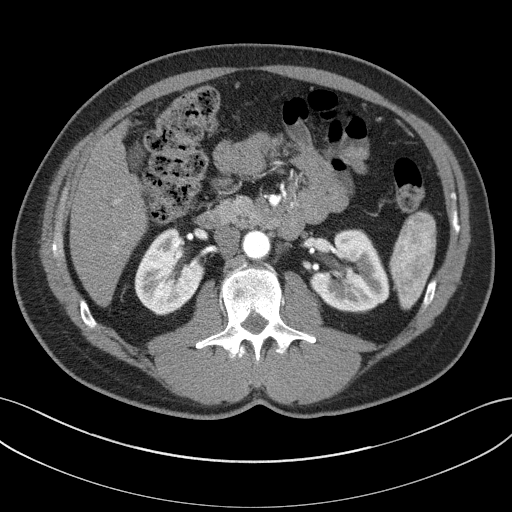
[im 29/135  lung]
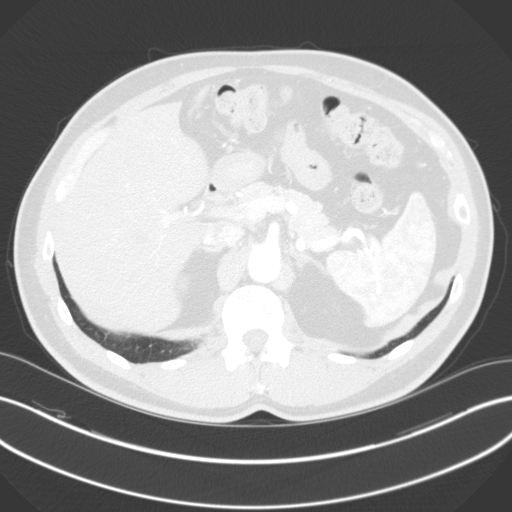
[im 36/135  soft-tissue]
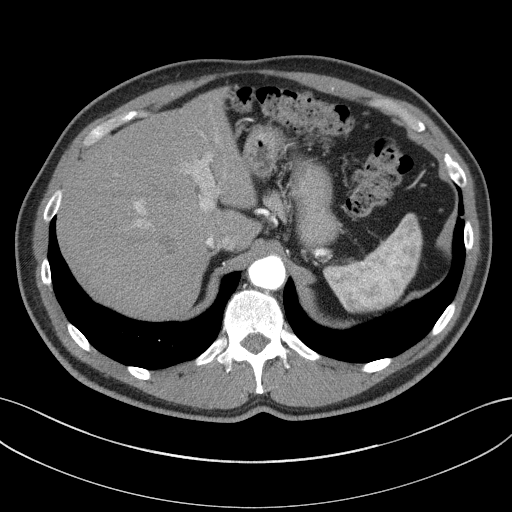
[im 43/135  lung]
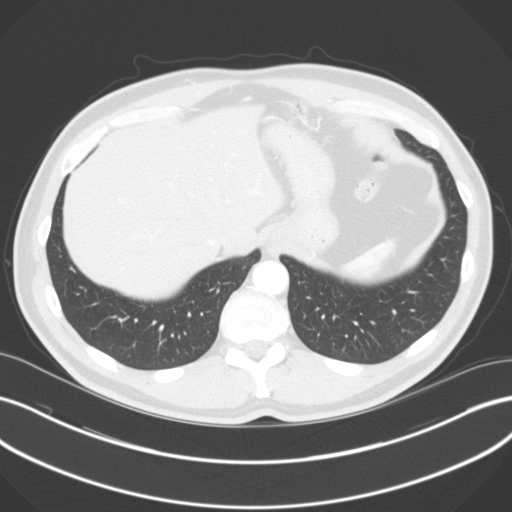
[im 50/135  soft-tissue]
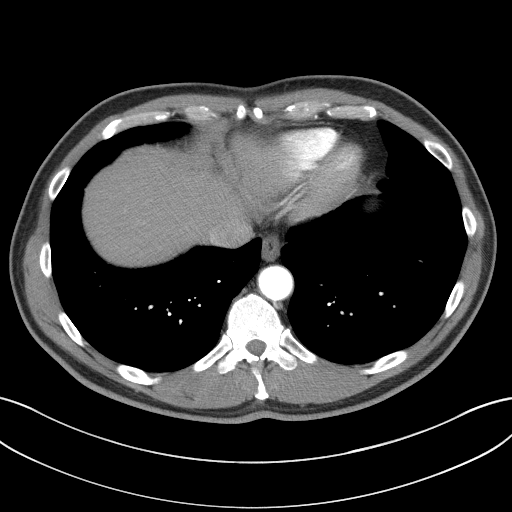
[im 57/135  lung]
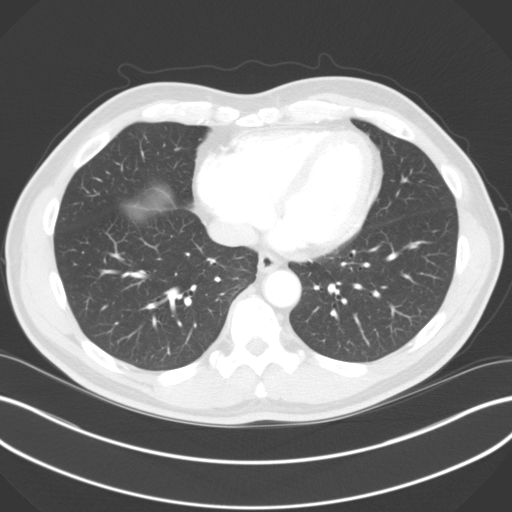
[im 71/135  soft-tissue]
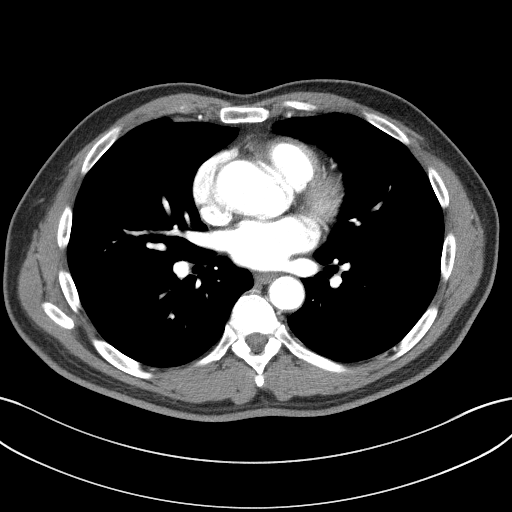
[im 78/135  lung]
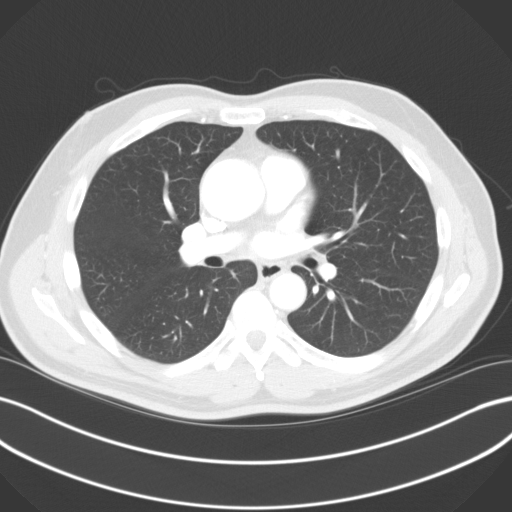
[im 85/135  soft-tissue]
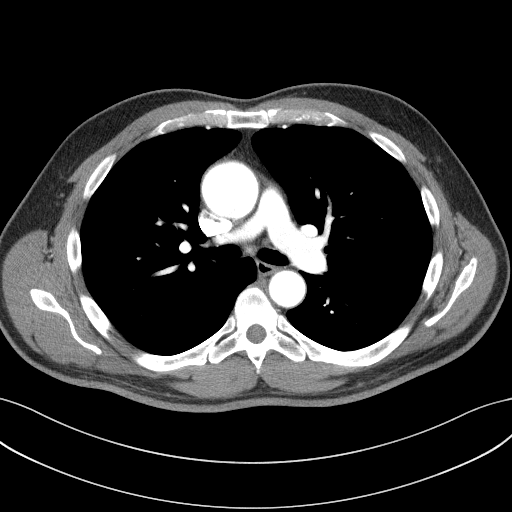
[im 92/135  lung]
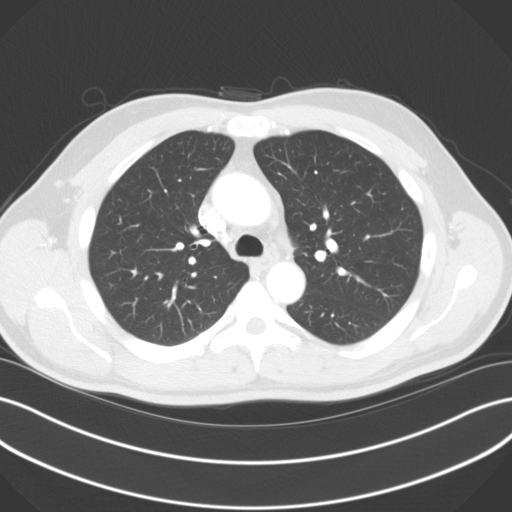
[im 99/135  soft-tissue]
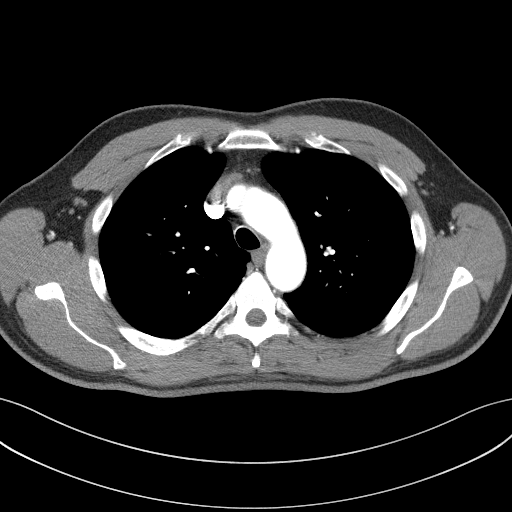
[im 113/135  lung]
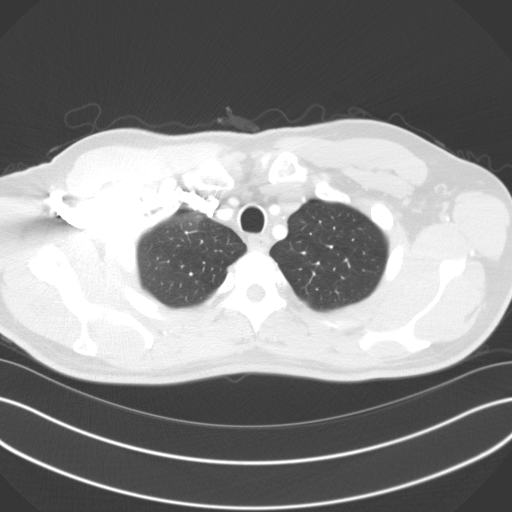
[im 120/135  soft-tissue]
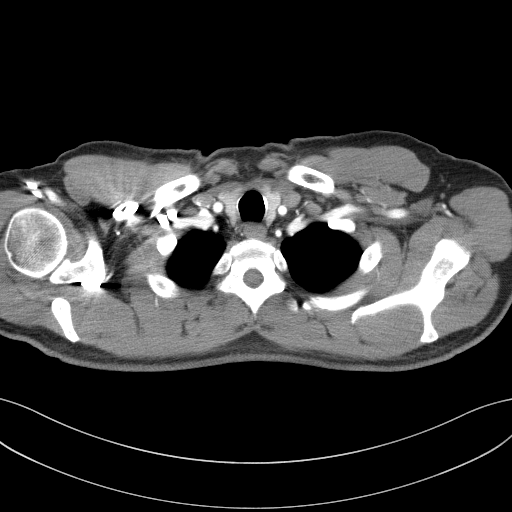
[im 127/135  lung]
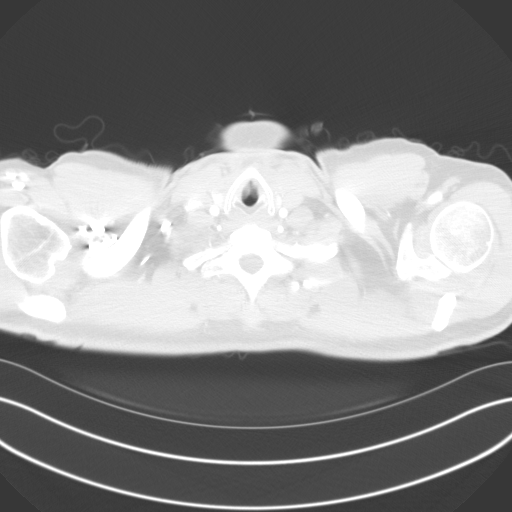

[Series 7: coronal · coronal · 0.80mm/px · 3 of 101 slices shown]
[im 26/101  soft-tissue]
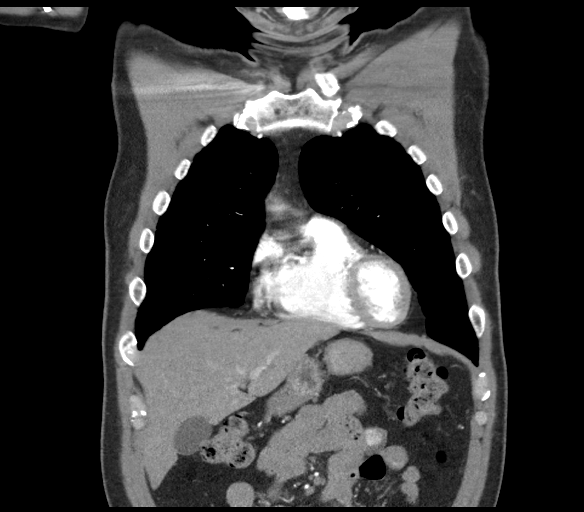
[im 51/101  soft-tissue]
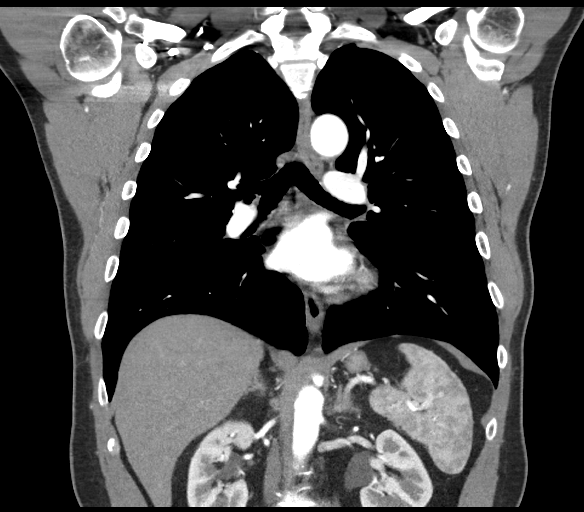
[im 76/101  soft-tissue]
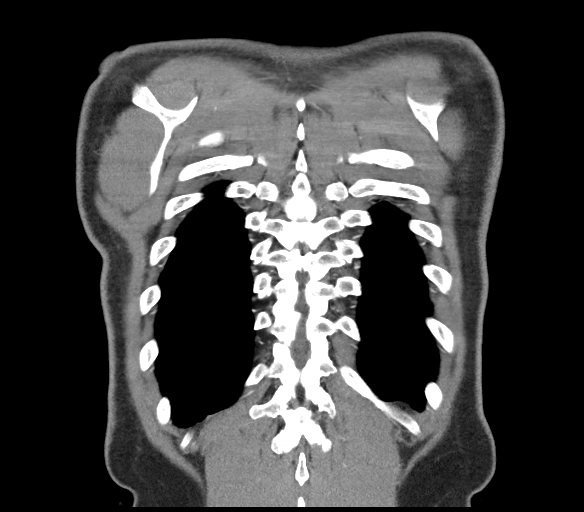

[18 of 46 positions shown; findings below may reference images not displayed]

FINDINGS: Cardiovascular: Heart is normal size. Mild stable aneurysmal
dilatation of the ascending thoracic aorta measuring 4.5 cm in AP
diameter. No evidence of dissection. Aortic arch and descending
thoracic aorta are normal in caliber. Normal 3 vessel takeoff from
the aortic arch. Pulmonary arterial system and remaining vascular
structures are unremarkable.

Mediastinum/Nodes: No mediastinal or hilar adenopathy. Remaining
mediastinal structures are normal.

Lungs/Pleura: Lungs are adequately inflated without airspace
consolidation or effusion. Airways are normal.

Upper Abdomen: Minimal calcified plaque over the abdominal aorta. No
acute findings.

Musculoskeletal: No acute findings.

Review of the MIP images confirms the above findings.
IMPRESSION: 1. Stable 4.5 cm aneurysm of the ascending thoracic aorta. Recommend
semi-annual imaging followup by CTA or MRA and referral to
cardiothoracic surgery if not already obtained. This recommendation
follows [id number]/AHA/AATS/ACR/ASA/SCA/THIBO/MARION/YUKIKO/ROJARD Guidelines
for the Diagnosis and Management of Patients With Thoracic Aortic
Disease. Circulation. 8131; 121: E266-e369. Aortic aneurysm NOS
(E7CPL-K5A.5).

2.  No acute cardiopulmonary disease.

3.  Aortic Atherosclerosis (E7CPL-XOU.U).

## 2021-12-24 ENCOUNTER — Other Ambulatory Visit: Payer: Self-pay | Admitting: Cardiology

## 2021-12-24 DIAGNOSIS — E7800 Pure hypercholesterolemia, unspecified: Secondary | ICD-10-CM

## 2022-01-31 ENCOUNTER — Other Ambulatory Visit: Payer: Self-pay

## 2022-01-31 ENCOUNTER — Encounter: Payer: Self-pay | Admitting: Cardiology

## 2022-01-31 DIAGNOSIS — I712 Thoracic aortic aneurysm, without rupture, unspecified: Secondary | ICD-10-CM

## 2022-02-01 ENCOUNTER — Other Ambulatory Visit: Payer: Self-pay | Admitting: *Deleted

## 2022-02-01 DIAGNOSIS — I712 Thoracic aortic aneurysm, without rupture, unspecified: Secondary | ICD-10-CM

## 2022-02-28 ENCOUNTER — Ambulatory Visit
Admission: RE | Admit: 2022-02-28 | Discharge: 2022-02-28 | Disposition: A | Discharge status: Home / Self Care | Payer: BC Managed Care – PPO | Source: Ambulatory Visit | Attending: Cardiology | Admitting: Cardiology

## 2022-02-28 DIAGNOSIS — I712 Thoracic aortic aneurysm, without rupture, unspecified: Secondary | ICD-10-CM

## 2022-02-28 DIAGNOSIS — I2510 Atherosclerotic heart disease of native coronary artery without angina pectoris: Secondary | ICD-10-CM | POA: Diagnosis not present

## 2022-02-28 MED ORDER — IOPAMIDOL (ISOVUE-370) INJECTION 76%
75.0000 mL | Freq: Once | INTRAVENOUS | Status: AC | PRN
  Administered 2022-02-28: 75 mL via INTRAVENOUS

## 2022-03-01 ENCOUNTER — Other Ambulatory Visit: Payer: BC Managed Care – PPO

## 2022-03-04 ENCOUNTER — Encounter: Payer: Self-pay | Admitting: Cardiology

## 2022-07-23 ENCOUNTER — Encounter: Payer: Self-pay | Admitting: Cardiology

## 2022-08-05 NOTE — Progress Notes (Signed)
HPI: FU TAA. Patient seen in the emergency room 2/20 with chest pain. Echocardiogram February 2020 showed normal LV function, mild diastolic dysfunction, trileaflet aortic valve; dilated ascending aorta at 49 mm.  Exercise treadmill February 2020 negative. Cardiac CTA August 2020 showed mild nonobstructive mixed plaque in the distal left main and proximal LAD, coronary calcium score of 4.58 and thoracic aortic aneurysm measuring 4.6 cm. Last CTA 10/23 showed 4.6 cm ascending thoracic aortic aneurysm.  Contacted the office with dizziness and added to my schedule today.  Since last seen he denies dyspnea on exertion, orthopnea, PND, pedal edema, exertional chest pain or syncope.  Recently he has had difficulties with dizziness.  Room spins and it increases with changing positions.  Current Outpatient Medications  Medication Sig Dispense Refill   Ascorbic Acid (VITAMIN C) 1000 MG tablet Take 1,000 mg by mouth daily.     aspirin 81 MG chewable tablet Chew 1 tablet (81 mg total) by mouth daily. 30 tablet 0   atorvastatin (LIPITOR) 40 MG tablet TAKE 1 TABLET BY MOUTH EVERY DAY 90 tablet 3   Cholecalciferol (VITAMIN D3 PO) Take 100 mcg by mouth daily.     escitalopram (LEXAPRO) 10 MG tablet Take 10 mg by mouth daily.     Melatonin 10 MG CAPS Take 1 capsule by mouth at bedtime.     Multiple Vitamin (MULTIVITAMIN WITH MINERALS) TABS tablet Take 1 tablet by mouth daily.     naproxen sodium (ALEVE) 220 MG tablet Take 440 mg by mouth daily.     Omega-3 Fatty Acids (EQL OMEGA 3 FISH OIL) 1400 MG CAPS Take 1,400 mg by mouth daily.     No current facility-administered medications for this visit.     Past Medical History:  Diagnosis Date   Hyperlipidemia     Past Surgical History:  Procedure Laterality Date   FISTULOTOMY  2000    Social History   Socioeconomic History   Marital status: Married    Spouse name: Not on file   Number of children: 2   Years of education: Not on file   Highest  education level: Not on file  Occupational History    Comment: Sales  Tobacco Use   Smoking status: Never   Smokeless tobacco: Never  Substance and Sexual Activity   Alcohol use: Yes    Comment: socially   Drug use: Never   Sexual activity: Not on file  Other Topics Concern   Not on file  Social History Narrative   Not on file   Social Determinants of Health   Financial Resource Strain: Not on file  Food Insecurity: Not on file  Transportation Needs: Not on file  Physical Activity: Not on file  Stress: Not on file  Social Connections: Not on file  Intimate Partner Violence: Not on file    Family History  Problem Relation Age of Onset   Alzheimer's disease Mother    CVA Father    Anxiety disorder Brother     ROS: no fevers or chills, productive cough, hemoptysis, dysphasia, odynophagia, melena, hematochezia, dysuria, hematuria, rash, seizure activity, orthopnea, PND, pedal edema, claudication. Remaining systems are negative.  Physical Exam: Well-developed well-nourished in no acute distress.  Skin is warm and dry.  HEENT is normal.  Neck is supple.  Chest is clear to auscultation with normal expansion.  Cardiovascular exam is regular rate and rhythm.  Abdominal exam nontender or distended. No masses palpated. Extremities show no edema. neuro grossly intact  ECG-normal sinus rhythm at a rate of 70, no ST changes.  Personally reviewed  A/P  1 dizziness-possibly secondary to vertigo.  I have asked him to follow-up with primary care concerning this issue.  2 thoracic aortic aneurysm-plan follow-up study April 2024.  3 coronary artery disease-mild on previous CTA.  Continue aspirin and statin.  4 hypertension-blood pressure controlled.   5 hyperlipidemia-continue statin.  6 history of palpitations-no recent symptoms.  Kirk Ruths, MD

## 2022-08-09 ENCOUNTER — Ambulatory Visit: Payer: BC Managed Care – PPO | Attending: Cardiology | Admitting: Cardiology

## 2022-08-09 ENCOUNTER — Encounter: Payer: Self-pay | Admitting: Cardiology

## 2022-08-09 VITALS — BP 122/72 | HR 70 | Ht 70.0 in | Wt 194.4 lb

## 2022-08-09 DIAGNOSIS — R42 Dizziness and giddiness: Secondary | ICD-10-CM

## 2022-08-09 DIAGNOSIS — I712 Thoracic aortic aneurysm, without rupture, unspecified: Secondary | ICD-10-CM | POA: Diagnosis not present

## 2022-08-09 DIAGNOSIS — E7800 Pure hypercholesterolemia, unspecified: Secondary | ICD-10-CM | POA: Diagnosis not present

## 2022-08-09 NOTE — Patient Instructions (Signed)
    Follow-Up: At  [facility], you and your health needs are our priority.  As part of our continuing mission to provide you with exceptional heart care, we have created designated Provider Care Teams.  These Care Teams include your primary Cardiologist (physician) and Advanced Practice Providers (APPs -  Physician Assistants and Nurse Practitioners) who all work together to provide you with the care you need, when you need it.  We recommend signing up for the patient portal called "MyChart".  Sign up information is provided on this After Visit Summary.  MyChart is used to connect with patients for Virtual Visits (Telemedicine).  Patients are able to view lab/test results, encounter notes, upcoming appointments, etc.  Non-urgent messages can be sent to your provider as well.   To learn more about what you can do with MyChart, go to [email or web address].    Your next appointment:   6 month(s)  Provider:   Brian Crenshaw, MD      

## 2022-08-29 ENCOUNTER — Encounter: Payer: Self-pay | Admitting: Cardiology

## 2022-08-29 DIAGNOSIS — I712 Thoracic aortic aneurysm, without rupture, unspecified: Secondary | ICD-10-CM

## 2022-09-13 ENCOUNTER — Ambulatory Visit
Admission: RE | Admit: 2022-09-13 | Discharge: 2022-09-13 | Disposition: A | Discharge status: Home / Self Care | Payer: BC Managed Care – PPO | Source: Ambulatory Visit | Attending: Cardiology | Admitting: Cardiology

## 2022-09-13 DIAGNOSIS — I712 Thoracic aortic aneurysm, without rupture, unspecified: Secondary | ICD-10-CM | POA: Diagnosis not present

## 2022-09-13 DIAGNOSIS — I2510 Atherosclerotic heart disease of native coronary artery without angina pectoris: Secondary | ICD-10-CM | POA: Diagnosis not present

## 2022-09-13 MED ORDER — IOPAMIDOL (ISOVUE-370) INJECTION 76%
75.0000 mL | Freq: Once | INTRAVENOUS | Status: AC | PRN
  Administered 2022-09-13: 75 mL via INTRAVENOUS

## 2022-10-28 DIAGNOSIS — Z0000 Encounter for general adult medical examination without abnormal findings: Secondary | ICD-10-CM | POA: Diagnosis not present

## 2022-10-28 DIAGNOSIS — Z23 Encounter for immunization: Secondary | ICD-10-CM | POA: Diagnosis not present

## 2022-11-01 DIAGNOSIS — R7303 Prediabetes: Secondary | ICD-10-CM | POA: Diagnosis not present

## 2022-11-01 DIAGNOSIS — Z125 Encounter for screening for malignant neoplasm of prostate: Secondary | ICD-10-CM | POA: Diagnosis not present

## 2022-11-01 DIAGNOSIS — E7800 Pure hypercholesterolemia, unspecified: Secondary | ICD-10-CM | POA: Diagnosis not present

## 2022-11-01 DIAGNOSIS — Z0000 Encounter for general adult medical examination without abnormal findings: Secondary | ICD-10-CM | POA: Diagnosis not present

## 2022-11-01 LAB — LAB REPORT - SCANNED
A1c: 5.7
EGFR: 89

## 2022-11-23 ENCOUNTER — Encounter: Payer: Self-pay | Admitting: Cardiology

## 2022-11-29 ENCOUNTER — Telehealth: Payer: Self-pay | Admitting: *Deleted

## 2022-11-29 DIAGNOSIS — E7800 Pure hypercholesterolemia, unspecified: Secondary | ICD-10-CM

## 2022-11-29 MED ORDER — EZETIMIBE 10 MG PO TABS
10.0000 mg | ORAL_TABLET | Freq: Every day | ORAL | 3 refills | Status: DC
  Filled 2023-06-30: qty 90, 90d supply, fill #0
  Filled 2023-09-28: qty 90, 90d supply, fill #1

## 2022-11-29 NOTE — Telephone Encounter (Signed)
Pt has reviewed results via my chart  New script sent to the pharmacy  Lab orders mailed to the pt  

## 2022-11-29 NOTE — Telephone Encounter (Signed)
-----   Message from Olga Millers sent at 11/26/2022  2:56 PM EDT ----- Add zetia 10 mg daily; lipids and liver 8 weeks Olga Millers

## 2022-12-17 DIAGNOSIS — L570 Actinic keratosis: Secondary | ICD-10-CM | POA: Diagnosis not present

## 2022-12-17 DIAGNOSIS — D239 Other benign neoplasm of skin, unspecified: Secondary | ICD-10-CM | POA: Diagnosis not present

## 2022-12-17 DIAGNOSIS — D224 Melanocytic nevi of scalp and neck: Secondary | ICD-10-CM | POA: Diagnosis not present

## 2022-12-31 ENCOUNTER — Other Ambulatory Visit: Payer: Self-pay | Admitting: Cardiology

## 2022-12-31 DIAGNOSIS — E7800 Pure hypercholesterolemia, unspecified: Secondary | ICD-10-CM

## 2023-02-07 DIAGNOSIS — E7800 Pure hypercholesterolemia, unspecified: Secondary | ICD-10-CM | POA: Diagnosis not present

## 2023-02-08 LAB — LIPID PANEL
Chol/HDL Ratio: 2.5 ratio (ref 0.0–5.0)
Cholesterol, Total: 121 mg/dL (ref 100–199)
HDL: 48 mg/dL (ref >39)
LDL Chol Calc (NIH): 60 mg/dL (ref 0–99)
Triglycerides: 60 mg/dL (ref 0–149)
VLDL Cholesterol Cal: 13 mg/dL (ref 5–40)

## 2023-02-08 LAB — HEPATIC FUNCTION PANEL
ALT: 38 IU/L (ref 0–44)
AST: 26 IU/L (ref 0–40)
Albumin: 4.1 g/dL (ref 3.9–4.9)
Alkaline Phosphatase: 79 IU/L (ref 44–121)
Bilirubin Total: 0.5 mg/dL (ref 0.0–1.2)
Bilirubin, Direct: 0.16 mg/dL (ref 0.00–0.40)
Total Protein: 6.8 g/dL (ref 6.0–8.5)

## 2023-02-10 ENCOUNTER — Other Ambulatory Visit: Payer: Self-pay | Admitting: *Deleted

## 2023-02-10 DIAGNOSIS — I712 Thoracic aortic aneurysm, without rupture, unspecified: Secondary | ICD-10-CM

## 2023-03-07 ENCOUNTER — Ambulatory Visit
Admission: RE | Admit: 2023-03-07 | Discharge: 2023-03-07 | Disposition: A | Discharge status: Home / Self Care | Payer: BC Managed Care – PPO | Source: Ambulatory Visit | Attending: Cardiology | Admitting: Cardiology

## 2023-03-07 DIAGNOSIS — I712 Thoracic aortic aneurysm, without rupture, unspecified: Secondary | ICD-10-CM

## 2023-03-07 DIAGNOSIS — I7121 Aneurysm of the ascending aorta, without rupture: Secondary | ICD-10-CM | POA: Diagnosis not present

## 2023-03-07 MED ORDER — IOPAMIDOL (ISOVUE-370) INJECTION 76%
200.0000 mL | Freq: Once | INTRAVENOUS | Status: AC | PRN
  Administered 2023-03-07: 75 mL via INTRAVENOUS

## 2023-03-13 ENCOUNTER — Encounter: Payer: Self-pay | Admitting: Cardiology

## 2023-03-14 ENCOUNTER — Other Ambulatory Visit: Payer: Self-pay | Admitting: *Deleted

## 2023-03-14 ENCOUNTER — Encounter: Payer: Self-pay | Admitting: Cardiology

## 2023-03-14 DIAGNOSIS — I712 Thoracic aortic aneurysm, without rupture, unspecified: Secondary | ICD-10-CM

## 2023-06-30 ENCOUNTER — Other Ambulatory Visit (HOSPITAL_BASED_OUTPATIENT_CLINIC_OR_DEPARTMENT_OTHER): Payer: Self-pay

## 2023-07-07 ENCOUNTER — Other Ambulatory Visit (HOSPITAL_BASED_OUTPATIENT_CLINIC_OR_DEPARTMENT_OTHER): Payer: Self-pay

## 2023-07-23 ENCOUNTER — Other Ambulatory Visit: Payer: Self-pay | Admitting: Cardiology

## 2023-07-23 DIAGNOSIS — E7800 Pure hypercholesterolemia, unspecified: Secondary | ICD-10-CM

## 2023-08-15 ENCOUNTER — Ambulatory Visit
Admission: RE | Admit: 2023-08-15 | Discharge: 2023-08-15 | Disposition: A | Discharge status: Home / Self Care | Source: Ambulatory Visit | Attending: Cardiology | Admitting: Cardiology

## 2023-08-15 DIAGNOSIS — I712 Thoracic aortic aneurysm, without rupture, unspecified: Secondary | ICD-10-CM

## 2023-08-15 MED ORDER — IOPAMIDOL (ISOVUE-370) INJECTION 76%
75.0000 mL | Freq: Once | INTRAVENOUS | Status: AC | PRN
  Administered 2023-08-15: 75 mL via INTRAVENOUS

## 2023-08-18 ENCOUNTER — Encounter: Payer: Self-pay | Admitting: Cardiology

## 2023-08-19 ENCOUNTER — Encounter: Payer: Self-pay | Admitting: *Deleted

## 2023-08-19 ENCOUNTER — Other Ambulatory Visit: Payer: Self-pay | Admitting: *Deleted

## 2023-08-19 DIAGNOSIS — I712 Thoracic aortic aneurysm, without rupture, unspecified: Secondary | ICD-10-CM

## 2023-08-20 ENCOUNTER — Other Ambulatory Visit (HOSPITAL_BASED_OUTPATIENT_CLINIC_OR_DEPARTMENT_OTHER): Payer: Self-pay

## 2023-08-20 MED ORDER — BETAMETHASONE DIPROPIONATE 0.05 % EX OINT
1.0000 | TOPICAL_OINTMENT | Freq: Every day | CUTANEOUS | 0 refills | Status: AC
  Filled 2023-08-20: qty 15, 30d supply, fill #0

## 2023-09-28 ENCOUNTER — Other Ambulatory Visit (HOSPITAL_BASED_OUTPATIENT_CLINIC_OR_DEPARTMENT_OTHER): Payer: Self-pay

## 2023-09-29 ENCOUNTER — Other Ambulatory Visit (HOSPITAL_BASED_OUTPATIENT_CLINIC_OR_DEPARTMENT_OTHER): Payer: Self-pay

## 2023-09-29 ENCOUNTER — Other Ambulatory Visit: Payer: Self-pay | Admitting: Cardiology

## 2023-09-29 DIAGNOSIS — E7800 Pure hypercholesterolemia, unspecified: Secondary | ICD-10-CM

## 2023-09-29 MED ORDER — EZETIMIBE 10 MG PO TABS
10.0000 mg | ORAL_TABLET | Freq: Every day | ORAL | 0 refills | Status: DC
  Filled 2023-09-29: qty 30, 30d supply, fill #0

## 2023-10-26 ENCOUNTER — Other Ambulatory Visit: Payer: Self-pay | Admitting: Cardiology

## 2023-10-26 DIAGNOSIS — E7800 Pure hypercholesterolemia, unspecified: Secondary | ICD-10-CM

## 2023-10-28 ENCOUNTER — Other Ambulatory Visit (HOSPITAL_BASED_OUTPATIENT_CLINIC_OR_DEPARTMENT_OTHER): Payer: Self-pay

## 2023-10-28 MED ORDER — EZETIMIBE 10 MG PO TABS
10.0000 mg | ORAL_TABLET | Freq: Every day | ORAL | 0 refills | Status: DC
  Filled 2023-10-28: qty 15, 15d supply, fill #0

## 2023-10-29 ENCOUNTER — Other Ambulatory Visit (HOSPITAL_BASED_OUTPATIENT_CLINIC_OR_DEPARTMENT_OTHER): Payer: Self-pay

## 2023-10-29 ENCOUNTER — Encounter: Payer: Self-pay | Admitting: Cardiology

## 2023-10-30 ENCOUNTER — Telehealth: Payer: Self-pay | Admitting: Cardiology

## 2023-10-30 ENCOUNTER — Other Ambulatory Visit (HOSPITAL_BASED_OUTPATIENT_CLINIC_OR_DEPARTMENT_OTHER): Payer: Self-pay

## 2023-10-30 DIAGNOSIS — E7800 Pure hypercholesterolemia, unspecified: Secondary | ICD-10-CM

## 2023-10-30 MED ORDER — EZETIMIBE 10 MG PO TABS
10.0000 mg | ORAL_TABLET | Freq: Every day | ORAL | 0 refills | Status: DC
  Filled 2023-10-30 – 2023-10-31 (×2): qty 15, 15d supply, fill #0

## 2023-10-30 NOTE — Telephone Encounter (Signed)
 Pt stated that he will need maybe one more supply of 15 Pill just to make it though vacation.  He setup an appt on 7/11 with provider to get his refills.  The pharmacy is the [facility] at [facility].

## 2023-10-30 NOTE — Telephone Encounter (Signed)
 Spoke with patient and confirmed he has appt with Leala Prince, PA-C on 11/28/23. Refill for Zetia  sent to [facility] at [facility], qty 15 to cover him until his appt.  Informed patient he must keep this appt for future refills.  Patient verbalized understanding and expressed appreciation for assistance.

## 2023-10-31 ENCOUNTER — Other Ambulatory Visit (HOSPITAL_BASED_OUTPATIENT_CLINIC_OR_DEPARTMENT_OTHER): Payer: Self-pay

## 2023-10-31 LAB — LAB REPORT - SCANNED
A1c: 5.8
EGFR: 94
TSH: 3.12 (ref 0.41–5.90)

## 2023-11-28 ENCOUNTER — Encounter: Payer: Self-pay | Admitting: Cardiology

## 2023-11-28 ENCOUNTER — Other Ambulatory Visit (HOSPITAL_BASED_OUTPATIENT_CLINIC_OR_DEPARTMENT_OTHER): Payer: Self-pay

## 2023-11-28 ENCOUNTER — Ambulatory Visit: Attending: Cardiology | Admitting: Cardiology

## 2023-11-28 VITALS — BP 114/70 | HR 74 | Ht 70.0 in | Wt 195.8 lb

## 2023-11-28 DIAGNOSIS — R42 Dizziness and giddiness: Secondary | ICD-10-CM | POA: Insufficient documentation

## 2023-11-28 DIAGNOSIS — I2510 Atherosclerotic heart disease of native coronary artery without angina pectoris: Secondary | ICD-10-CM | POA: Diagnosis not present

## 2023-11-28 DIAGNOSIS — E7800 Pure hypercholesterolemia, unspecified: Secondary | ICD-10-CM | POA: Diagnosis not present

## 2023-11-28 DIAGNOSIS — I712 Thoracic aortic aneurysm, without rupture, unspecified: Secondary | ICD-10-CM | POA: Insufficient documentation

## 2023-11-28 MED ORDER — EZETIMIBE 10 MG PO TABS
10.0000 mg | ORAL_TABLET | Freq: Every day | ORAL | 3 refills | Status: AC
  Filled 2023-11-28: qty 90, 90d supply, fill #0
  Filled 2024-02-29: qty 90, 90d supply, fill #1
  Filled 2024-05-30: qty 90, 90d supply, fill #2

## 2023-11-28 NOTE — Patient Instructions (Signed)
 Medication Instructions:  Your physician recommends that you continue on your current medications as directed. Please refer to the Current Medication list given to you today.  *If you need a refill on your cardiac medications before your next appointment, please call your pharmacy*  Lab Work: None ordered  If you have labs (blood work) drawn today and your tests are completely normal, you will receive your results only by: MyChart Message (if you have MyChart) OR A paper copy in the mail If you have any lab test that is abnormal or we need to change your treatment, we will call you to review the results.  Testing/Procedures: None ordered  Follow-Up: At [facility], you and your health needs are our priority.  As part of our continuing mission to provide you with exceptional heart care, our providers are all part of one team.  This team includes your primary Cardiologist (physician) and Advanced Practice Providers or APPs (Physician Assistants and Nurse Practitioners) who all work together to provide you with the care you need, when you need it.  Your next appointment:   1 year(s)  Provider:   Redell Shallow, MD    We recommend signing up for the patient portal called MyChart.  Sign up information is provided on this After Visit Summary.  MyChart is used to connect with patients for Virtual Visits (Telemedicine).  Patients are able to view lab/test results, encounter notes, upcoming appointments, etc.  Non-urgent messages can be sent to your provider as well.   To learn more about what you can do with MyChart, go to [email or web address].   Other Instructions

## 2023-11-28 NOTE — Progress Notes (Signed)
 Cardiology Office Note:   Date:  11/28/2023  ID:  Raymond Wilson, DOB 01-27-1956, MRN [id number] PCP: Kip Righter, MD  [facility] Providers Cardiologist:  Redell Shallow, MD    History of Present Illness:   Discussed the use of AI scribe software for clinical note transcription with the patient, who gave verbal consent to proceed.  History of Present Illness Raymond Wilson is a 68 year old male with thoracic aortic aneurysm, non-obstructive coronary artery disease, hypertension, and hyperlipidemia who presents for routine follow-up.  He has a thoracic aortic aneurysm that has remained stable over the past five years. The most recent CT angiogram of the chest showed an unchanged fusiform aneurysm of the ascending aorta measuring 4.6 cm. He undergoes annual imaging to monitor this condition.  He has coronary artery disease, noted as mild on a previous CTA in 2020. He experiences no significant chest pain or shortness of breath during exercise. He continues to exercise regularly, walking three miles four to five days a week without pain.  He experiences occasional palpitations, typically triggered by excessive caffeine intake or overexertion. These episodes are brief and occur in the left chest area, with one or two episodes in the last six months.  He has a history of dizziness, attributed to vertigo. Exercises provided by his primary care physician have significantly reduced these symptoms over the past six months. He occasionally experiences dizziness, but it resolves quickly with certain head movements.  He is on medication for hyperlipidemia, including 40 mg of atorvastatin  and 10 mg of ezetimibe . His most recent LDL was 54, an improvement from a previous level of 77. He monitors his cholesterol levels regularly, with the last check in June showing a decrease in cholesterol levels.  No issues with swelling in the legs and he sleeps well with the aid of melatonin, although  he wakes up once or twice a night. No shortness of breath at night.  Studies Reviewed:    EKG:   EKG Interpretation Date/Time:  Friday November 28 2023 08:46:50 EDT Ventricular Rate:  71 PR Interval:  194 QRS Duration:  86 QT Interval:  374 QTC Calculation: 406 R Axis:   52  Text Interpretation: Normal sinus rhythm Low voltage QRS When compared with ECG of 28-Jun-2018 07:41, PREVIOUS ECG IS PRESENT Confirmed by Trudy Birmingham [phone]) on 11/28/2023 9:09:03 AM   08/15/23 CT angio chest aorta reviewed   Risk Assessment/Calculations:              Physical Exam:   VS:  BP 114/70   Pulse 74   Ht 5' 10 (1.778 m)   Wt 195 lb 12.8 oz (88.8 kg)   SpO2 93%   BMI 28.09 kg/m    Wt Readings from Last 3 Encounters:  11/28/23 195 lb 12.8 oz (88.8 kg)  08/09/22 194 lb 6.4 oz (88.2 kg)  08/10/21 204 lb (92.5 kg)     Physical Exam Vitals reviewed.  Constitutional:      Appearance: Normal appearance.  HENT:     Head: Normocephalic.  Eyes:     Pupils: Pupils are equal, round, and reactive to light.  Cardiovascular:     Rate and Rhythm: Normal rate and regular rhythm.     Pulses: Normal pulses.     Heart sounds: Normal heart sounds.  Pulmonary:     Effort: Pulmonary effort is normal.     Breath sounds: Normal breath sounds.  Abdominal:     General: Abdomen is flat.  Palpations: Abdomen is soft.  Musculoskeletal:     Right lower leg: No edema.     Left lower leg: No edema.  Skin:    General: Skin is warm and dry.     Capillary Refill: Capillary refill takes less than 2 seconds.  Neurological:     General: No focal deficit present.     Mental Status: He is alert and oriented to person, place, and time.  Psychiatric:        Mood and Affect: Mood normal.        Behavior: Behavior normal.        Thought Content: Thought content normal.        Judgment: Judgment normal.     ASSESSMENT AND PLAN:    Assessment & Plan Thoracic aortic aneurysm Fusiform aneurysm of the  ascending aorta measuring 4.6 cm, unchanged over the past five years. Annual CT angiography shows no progression.  - Follow-up CT angiography in one year (April 2026)  Nonobstructive coronary artery disease Nonobstructive coronary artery disease noted on 2020 CTA. No significant chest pain or shortness of breath during exercise, suggesting no progression. LDL cholesterol well-controlled at 54 mg/dL with current medication regimen. - Continue atorvastatin  40 mg daily - Continue ezetimibe  10 mg daily - Continue ASA 81mg  - Advise to avoid fried foods, fatty foods, and red meat to maintain cholesterol levels  Hyperlipidemia LDL cholesterol is excellently controlled at 54 mg/dL. Ezetimibe  was added when LDL increased to 77 mg/dL, and it has effectively reduced LDL levels.  - Continue atorvastatin  40 mg daily - Continue ezetimibe  10 mg daily  Hypertension Blood pressure is well-controlled with current management.  Palpitations Infrequent palpitations, occurring once or twice in the last six months, often triggered by excessive caffeine intake or exertion. No significant concern at this time. - Advise to monitor caffeine intake and avoid excessive exertion  Dizziness due to vertigo Dizziness attributed to vertigo, improved with exercises provided by primary care. Symptoms are infrequent and manageable with head movements. - Continue exercises as advised by primary care       1 year follow up  Signed, Artist Pouch, PA-C

## 2024-05-31 ENCOUNTER — Other Ambulatory Visit (HOSPITAL_BASED_OUTPATIENT_CLINIC_OR_DEPARTMENT_OTHER): Payer: Self-pay

## 2024-07-23 ENCOUNTER — Other Ambulatory Visit
